# Patient Record
Sex: Female | Born: 1940
Health system: Southern US, Community
[De-identification: ages and names within clinical notes are randomized; demographics above are authoritative.]

## PROBLEM LIST (undated history)

## (undated) DIAGNOSIS — I10 Essential (primary) hypertension: Secondary | ICD-10-CM

## (undated) DIAGNOSIS — E559 Vitamin D deficiency, unspecified: Secondary | ICD-10-CM

## (undated) DIAGNOSIS — E079 Disorder of thyroid, unspecified: Secondary | ICD-10-CM

## (undated) DIAGNOSIS — M1990 Unspecified osteoarthritis, unspecified site: Secondary | ICD-10-CM

## (undated) DIAGNOSIS — Z8489 Family history of other specified conditions: Secondary | ICD-10-CM

## (undated) DIAGNOSIS — J189 Pneumonia, unspecified organism: Secondary | ICD-10-CM

## (undated) DIAGNOSIS — H3530 Unspecified macular degeneration: Secondary | ICD-10-CM

## (undated) DIAGNOSIS — N390 Urinary tract infection, site not specified: Secondary | ICD-10-CM

## (undated) DIAGNOSIS — D510 Vitamin B12 deficiency anemia due to intrinsic factor deficiency: Secondary | ICD-10-CM

## (undated) DIAGNOSIS — F419 Anxiety disorder, unspecified: Secondary | ICD-10-CM

## (undated) DIAGNOSIS — K219 Gastro-esophageal reflux disease without esophagitis: Secondary | ICD-10-CM

## (undated) DIAGNOSIS — H3320 Serous retinal detachment, unspecified eye: Secondary | ICD-10-CM

## (undated) DIAGNOSIS — R519 Headache, unspecified: Secondary | ICD-10-CM

## (undated) DIAGNOSIS — I8390 Asymptomatic varicose veins of unspecified lower extremity: Secondary | ICD-10-CM

## (undated) DIAGNOSIS — M797 Fibromyalgia: Secondary | ICD-10-CM

## (undated) DIAGNOSIS — K259 Gastric ulcer, unspecified as acute or chronic, without hemorrhage or perforation: Secondary | ICD-10-CM

## (undated) DIAGNOSIS — R32 Unspecified urinary incontinence: Secondary | ICD-10-CM

## (undated) DIAGNOSIS — N189 Chronic kidney disease, unspecified: Secondary | ICD-10-CM

## (undated) DIAGNOSIS — E039 Hypothyroidism, unspecified: Secondary | ICD-10-CM

## (undated) DIAGNOSIS — D649 Anemia, unspecified: Secondary | ICD-10-CM

## (undated) DIAGNOSIS — I701 Atherosclerosis of renal artery: Secondary | ICD-10-CM

## (undated) HISTORY — PX: COLONOSCOPY: SHX174

## (undated) HISTORY — DX: Unspecified urinary incontinence: R32

## (undated) HISTORY — DX: Anemia, unspecified: D64.9

## (undated) HISTORY — PX: APPENDECTOMY: SHX54

## (undated) HISTORY — DX: Hypothyroidism, unspecified: E03.9

## (undated) HISTORY — PX: BLADDER SUSPENSION: SHX72

## (undated) HISTORY — DX: Unspecified macular degeneration: H35.30

## (undated) HISTORY — DX: Unspecified osteoarthritis, unspecified site: M19.90

## (undated) HISTORY — DX: Serous retinal detachment, unspecified eye: H33.20

## (undated) HISTORY — DX: Fibromyalgia: M79.7

## (undated) HISTORY — DX: Vitamin B12 deficiency anemia due to intrinsic factor deficiency: D51.0

## (undated) HISTORY — PX: ABDOMINAL HYSTERECTOMY: SHX81

## (undated) HISTORY — DX: Disorder of thyroid, unspecified: E07.9

## (undated) HISTORY — DX: Asymptomatic varicose veins of unspecified lower extremity: I83.90

## (undated) HISTORY — DX: Essential (primary) hypertension: I10

## (undated) HISTORY — DX: Gastric ulcer, unspecified as acute or chronic, without hemorrhage or perforation: K25.9

## (undated) HISTORY — DX: Vitamin D deficiency, unspecified: E55.9

## (undated) HISTORY — DX: Atherosclerosis of renal artery: I70.1

---

## 1998-04-15 ENCOUNTER — Other Ambulatory Visit: Admission: RE | Admit: 1998-04-15 | Discharge: 1998-04-15 | Payer: Self-pay | Admitting: Obstetrics and Gynecology

## 1999-07-30 ENCOUNTER — Encounter: Payer: Self-pay | Admitting: Specialist

## 1999-07-30 ENCOUNTER — Encounter: Admission: RE | Admit: 1999-07-30 | Discharge: 1999-07-30 | Payer: Self-pay | Admitting: Specialist

## 2002-10-09 ENCOUNTER — Encounter: Admission: RE | Admit: 2002-10-09 | Discharge: 2002-10-09 | Payer: Self-pay | Admitting: Specialist

## 2002-10-09 ENCOUNTER — Encounter: Payer: Self-pay | Admitting: Specialist

## 2003-12-12 ENCOUNTER — Other Ambulatory Visit: Admission: RE | Admit: 2003-12-12 | Discharge: 2003-12-12 | Payer: Self-pay | Admitting: General Surgery

## 2005-08-26 ENCOUNTER — Encounter: Admission: RE | Admit: 2005-08-26 | Discharge: 2005-08-26 | Payer: Self-pay | Admitting: Family Medicine

## 2007-08-24 ENCOUNTER — Encounter: Admission: RE | Admit: 2007-08-24 | Discharge: 2007-08-24 | Payer: Self-pay | Admitting: Family Medicine

## 2009-09-11 ENCOUNTER — Encounter: Admission: RE | Admit: 2009-09-11 | Discharge: 2009-09-11 | Payer: Self-pay | Admitting: Family Medicine

## 2009-10-09 ENCOUNTER — Encounter: Admission: RE | Admit: 2009-10-09 | Discharge: 2009-10-09 | Payer: Self-pay | Admitting: Family Medicine

## 2009-10-10 ENCOUNTER — Encounter: Admission: RE | Admit: 2009-10-10 | Discharge: 2009-11-14 | Payer: Self-pay | Admitting: Family Medicine

## 2010-02-21 ENCOUNTER — Emergency Department (HOSPITAL_BASED_OUTPATIENT_CLINIC_OR_DEPARTMENT_OTHER): Admission: EM | Admit: 2010-02-21 | Discharge: 2010-02-21 | Payer: Self-pay | Admitting: Emergency Medicine

## 2010-04-08 ENCOUNTER — Emergency Department (HOSPITAL_BASED_OUTPATIENT_CLINIC_OR_DEPARTMENT_OTHER): Admission: EM | Admit: 2010-04-08 | Discharge: 2010-04-09 | Payer: Self-pay | Admitting: Emergency Medicine

## 2010-09-07 ENCOUNTER — Encounter: Payer: Self-pay | Admitting: Family Medicine

## 2010-11-02 LAB — URINALYSIS, ROUTINE W REFLEX MICROSCOPIC
Glucose, UA: NEGATIVE mg/dL
Hgb urine dipstick: NEGATIVE
Ketones, ur: NEGATIVE mg/dL
Nitrite: NEGATIVE
Protein, ur: NEGATIVE mg/dL
Specific Gravity, Urine: 1.02 (ref 1.005–1.030)
Urobilinogen, UA: 0.2 mg/dL (ref 0.0–1.0)
pH: 5 (ref 5.0–8.0)

## 2010-11-02 LAB — BASIC METABOLIC PANEL
BUN: 42 mg/dL — ABNORMAL HIGH (ref 6–23)
CO2: 23 mEq/L (ref 19–32)
Calcium: 9.2 mg/dL (ref 8.4–10.5)
Chloride: 103 mEq/L (ref 96–112)
Creatinine, Ser: 2.2 mg/dL — ABNORMAL HIGH (ref 0.4–1.2)
GFR calc non Af Amer: 22 mL/min — ABNORMAL LOW (ref >60)
Glucose, Bld: 82 mg/dL (ref 70–99)
Potassium: 5.1 mEq/L (ref 3.5–5.1)
Sodium: 138 mEq/L (ref 135–145)

## 2011-03-25 ENCOUNTER — Encounter (INDEPENDENT_AMBULATORY_CARE_PROVIDER_SITE_OTHER): Payer: Medicare Other | Admitting: Ophthalmology

## 2011-03-25 DIAGNOSIS — H43819 Vitreous degeneration, unspecified eye: Secondary | ICD-10-CM

## 2011-03-25 DIAGNOSIS — H3530 Unspecified macular degeneration: Secondary | ICD-10-CM

## 2011-04-07 ENCOUNTER — Encounter: Payer: Self-pay | Admitting: Surgery

## 2011-05-12 ENCOUNTER — Encounter: Payer: Self-pay | Admitting: Vascular Surgery

## 2011-05-13 ENCOUNTER — Ambulatory Visit (INDEPENDENT_AMBULATORY_CARE_PROVIDER_SITE_OTHER): Payer: Medicare Other | Admitting: Vascular Surgery

## 2011-05-13 ENCOUNTER — Encounter: Payer: Self-pay | Admitting: Vascular Surgery

## 2011-05-13 ENCOUNTER — Ambulatory Visit (INDEPENDENT_AMBULATORY_CARE_PROVIDER_SITE_OTHER): Payer: Medicare Other | Admitting: *Deleted

## 2011-05-13 VITALS — BP 149/67 | HR 61 | Resp 16 | Ht 63.0 in | Wt 155.0 lb

## 2011-05-13 DIAGNOSIS — I83893 Varicose veins of bilateral lower extremities with other complications: Secondary | ICD-10-CM

## 2011-05-13 NOTE — Progress Notes (Signed)
The patient presents today for evaluation of bilateral lower extremity venous hypertension. She has had multiple telangiectasia and reticular veins bilaterally. She has been no history of DVT. She did have a recent episode of bleeding from her right ankle. She had shaved and have the bleeding from the superficial telangiectasia on her lateral ankle. EMS was called controlled this with pressure. She's had no other prior or subsequent bleeding. She does have a history of prior sclerotherapy and in the distant past but no prior venous surgery. He does not have any discomfort related to the venous pathology. She does not have any significant swelling in her lower extremities.  Past Medical History  Diagnosis Date  . Hypertension   . Anemia   . Thyroid disease   . Varicose veins     History  Substance Use Topics  . Smoking status: Never Smoker   . Smokeless tobacco: Never Used  . Alcohol Use: No    History reviewed. No pertinent family history.  Allergies  Allergen Reactions  . Astelin   . Ceftin   . Codeine   . Cortisone   . Dyclonine     Pt. States she experienced a seizure after taking this medication.  Gaynelle Cage Flavor   . Toviaz (Fesoterodine Fumarate) Nausea Only  . Zestril (Lisinopril) Cough  . Zithromax (Azithromycin Dihydrate)   . Zofran Rash    Current outpatient prescriptions:amLODipine-benazepril (LOTREL) 5-10 MG per capsule, Take 1 capsule by mouth daily.  , Disp: , Rfl: ;  Calcium Carbonate-Vitamin D (CALCIUM + D PO), Take by mouth.  , Disp: , Rfl: ;  Cyanocobalamin (VITAMIN B-12 IJ), Inject 1,000 mg as directed every 21 ( twenty-one) days.  , Disp: , Rfl: ;  diphenhydrAMINE (BENADRYL) 25 mg capsule, Take 50 mg by mouth every 6 (six) hours as needed.  , Disp: , Rfl:  fexofenadine (ALLEGRA) 180 MG tablet, Take 180 mg by mouth daily.  , Disp: , Rfl: ;  fluticasone (FLONASE) 50 MCG/ACT nasal spray, Place 2 sprays into the nose daily.  , Disp: , Rfl: ;  folic acid  (FOLVITE) 800 MCG tablet, Take 800 mcg by mouth daily.  , Disp: , Rfl: ;  Glucosamine-Chondroitin-MSM (TRIPLE FLEX) 500-400-125 MG TABS, Take 1 tablet by mouth daily as needed.  , Disp: , Rfl:  levothyroxine (SYNTHROID, LEVOTHROID) 112 MCG tablet, Take 112 mcg by mouth daily.  , Disp: , Rfl: ;  losartan (COZAAR) 100 MG tablet, Take 100 mg by mouth daily.  , Disp: , Rfl: ;  Multiple Vitamins-Minerals (CENTRUM SILVER ULTRA WOMENS) TABS, Take by mouth.  , Disp: , Rfl: ;  Omega-3 Fatty Acids (FISH OIL DOUBLE STRENGTH) 1200 MG CAPS, Take 1 capsule by mouth daily.  , Disp: , Rfl:  Polyethyl Glycol-Propyl Glycol (SYSTANE) 0.4-0.3 % SOLN, Apply 1 drop to eye 3 (three) times daily.  , Disp: , Rfl: ;  propranolol (INDERAL) 10 MG tablet, Take 10 mg by mouth as needed.  , Disp: , Rfl: ;  sulfamethoxazole-trimethoprim (BACTRIM DS) 800-160 MG per tablet, Take 1 tablet by mouth 2 (two) times daily.  , Disp: , Rfl: ;  Vitamin D, Ergocalciferol, (DRISDOL) 50000 UNITS CAPS, Take 50,000 Units by mouth.  , Disp: , Rfl:  estrogens, conjugated, (PREMARIN) 0.625 MG tablet, Take 0.625 mg by mouth daily. Take daily for 21 days then do not take for 7 days. , Disp: , Rfl:   BP 149/67  Pulse 61  Resp 16  Ht 5\' 3"  (1.6  m)  Wt 155 lb (70.308 kg)  BMI 27.46 kg/m2  Body mass index is 27.46 kg/(m^2).      Review of systems: No cardiac disease review of systems otherwise normal.  Physical exam: Well-developed well-nourished white female no acute distress. HEENT is normal. Radial and dorsalis pedis pulses 2+ bilaterally. Musculoskeletal no major deformity sinuses. Neurologic no focal weakness paresthesias. Skin without ulcers or rashes. She does have telangiectasia on both lower extremities and her upper thighs and extending down onto her ankles and dorsal her feet bilaterally.  Noninvasive venous study: No evidence of DVT. She does have some reflux in her left deep system. He does not have any significant reflux in her greater  small saphenous veins bilaterally. He does have a great deal of tributary branches arising from her saphenofemoral junction was particular the right side.   Impression and plan: No significant saphenous or deep venous reflux. History of bleed from a right ankle telangiectasia. Did discuss first a for this with elevation and compression. Did explain the possible treatment for telangiectasia with the sclerotherapy. She is concern regarding the appearance of these. That this would be out-of-pocket expenses this would not entered covered by insurance carrier. He will notify us if he wishes to proceed with treatment.

## 2011-05-20 NOTE — Procedures (Unsigned)
LOWER EXTREMITY VENOUS REFLUX EXAM  INDICATION:  Painful varicose veins.  EXAM:  Using color-flow imaging and pulse Doppler spectral analysis, the bilateral common femoral, superficial femoral, popliteal, posterior tibial, greater and lesser saphenous veins are evaluated.  There is evidence suggesting deep venous insufficiency in the left lower extremity.  The bilateral saphenofemoral junction is competent. The bilateral GSV is competent.  The left proximal short saphenous vein demonstrates incompetency with the calibers ranging from 0.48 cm to 0.34 cm.  GSV Diameter (used if found to be incompetent only)                                           Right    Left Proximal Greater Saphenous Vein           cm       cm Proximal-to-mid-thigh                     cm       cm Mid thigh                                 cm       cm Mid-distal thigh                          cm       cm Distal thigh                              cm       cm Knee                                      cm       cm  IMPRESSION: 1. Bilateral greater saphenous veins are competent. 2. The left deep venous system is not competent with Reflux of     >517milliseconds. 3. The left lesser saphenous vein is not competent with Reflux of     >568milliseconds.  ___________________________________________ Larina Earthly, M.D.  LT/MEDQ  D:  05/13/2011  T:  05/13/2011  Job:  [id number]

## 2012-01-25 ENCOUNTER — Other Ambulatory Visit: Payer: Self-pay | Admitting: Interventional Cardiology

## 2012-01-27 ENCOUNTER — Encounter (HOSPITAL_COMMUNITY): Payer: Self-pay | Admitting: Pharmacist

## 2012-01-27 MED ORDER — DIPHENHYDRAMINE HCL 50 MG/ML IJ SOLN: 25.0000 mg | Freq: Once | INTRAMUSCULAR | Status: AC

## 2012-01-27 MED ORDER — SODIUM CHLORIDE 0.9 % IV SOLN: 20.0000 mg | Freq: Once | INTRAVENOUS | Status: AC

## 2012-01-28 ENCOUNTER — Ambulatory Visit (HOSPITAL_COMMUNITY)
Admission: RE | Admit: 2012-01-28 | Discharge: 2012-01-28 | Disposition: A | Discharge status: Home / Self Care | Payer: Medicare Other | Source: Ambulatory Visit | Attending: Interventional Cardiology | Admitting: Interventional Cardiology

## 2012-01-28 ENCOUNTER — Encounter (HOSPITAL_COMMUNITY)
Admission: RE | Disposition: A | Discharge status: Home / Self Care | Payer: Self-pay | Source: Ambulatory Visit | Attending: Interventional Cardiology

## 2012-01-28 DIAGNOSIS — R935 Abnormal findings on diagnostic imaging of other abdominal regions, including retroperitoneum: Secondary | ICD-10-CM | POA: Insufficient documentation

## 2012-01-28 DIAGNOSIS — I10 Essential (primary) hypertension: Secondary | ICD-10-CM | POA: Insufficient documentation

## 2012-01-28 HISTORY — PX: RENAL ANGIOGRAM: SHX5509

## 2012-01-28 SURGERY — RENAL ANGIOGRAM
Anesthesia: LOCAL

## 2012-01-28 MED ORDER — HYDRALAZINE HCL 20 MG/ML IJ SOLN
INTRAMUSCULAR | Status: AC
  Filled 2012-01-28: qty 1

## 2012-01-28 MED ORDER — SODIUM CHLORIDE 0.9 % IJ SOLN: 3.0000 mL | INTRAMUSCULAR | Status: DC | PRN

## 2012-01-28 MED ORDER — FAMOTIDINE IN NACL 20-0.9 MG/50ML-% IV SOLN: 20.0000 mg | Freq: Once | INTRAVENOUS | Status: DC

## 2012-01-28 MED ORDER — SODIUM CHLORIDE 0.9 % IV SOLN: 250.0000 mL | INTRAVENOUS | Status: DC | PRN

## 2012-01-28 MED ORDER — ASPIRIN 81 MG PO CHEW
324.0000 mg | CHEWABLE_TABLET | ORAL | Status: AC
  Administered 2012-01-28: 324 mg via ORAL

## 2012-01-28 MED ORDER — PROPRANOLOL HCL 10 MG PO TABS
10.0000 mg | ORAL_TABLET | ORAL | Status: AC
  Administered 2012-01-28: 10 mg via ORAL
  Filled 2012-01-28: qty 1

## 2012-01-28 MED ORDER — FENTANYL CITRATE 0.05 MG/ML IJ SOLN
INTRAMUSCULAR | Status: AC
  Filled 2012-01-28: qty 2

## 2012-01-28 MED ORDER — DIAZEPAM 5 MG PO TABS
5.0000 mg | ORAL_TABLET | ORAL | Status: AC
  Administered 2012-01-28: 5 mg via ORAL

## 2012-01-28 MED ORDER — DIPHENHYDRAMINE HCL 50 MG/ML IJ SOLN: 25.0000 mg | Freq: Once | INTRAMUSCULAR | Status: DC

## 2012-01-28 MED ORDER — DIAZEPAM 5 MG PO TABS
ORAL_TABLET | ORAL | Status: AC
  Administered 2012-01-28: 5 mg via ORAL
  Filled 2012-01-28: qty 1

## 2012-01-28 MED ORDER — HEPARIN (PORCINE) IN NACL 2-0.9 UNIT/ML-% IJ SOLN
INTRAMUSCULAR | Status: AC
  Filled 2012-01-28: qty 1000

## 2012-01-28 MED ORDER — MIDAZOLAM HCL 2 MG/2ML IJ SOLN
INTRAMUSCULAR | Status: AC
  Filled 2012-01-28: qty 2

## 2012-01-28 MED ORDER — SODIUM CHLORIDE 0.9 % IJ SOLN: 3.0000 mL | Freq: Two times a day (BID) | INTRAMUSCULAR | Status: DC

## 2012-01-28 MED ORDER — LIDOCAINE HCL (PF) 1 % IJ SOLN
INTRAMUSCULAR | Status: AC
  Filled 2012-01-28: qty 30

## 2012-01-28 MED ORDER — SODIUM CHLORIDE 0.9 % IV SOLN
INTRAVENOUS | Status: DC
  Administered 2012-01-28: 08:00:00 via INTRAVENOUS

## 2012-01-28 MED ORDER — ASPIRIN 81 MG PO CHEW
CHEWABLE_TABLET | ORAL | Status: AC
  Administered 2012-01-28: 324 mg via ORAL
  Filled 2012-01-28: qty 4

## 2012-01-28 NOTE — CV Procedure (Signed)
PROCEDURE:  Abdominal aortogram.  Bilateral selective renal angiogram.  INDICATIONS:  HTN, abnormal renal duplex.  The risks, benefits, and details of the procedure were explained to the patient.  The patient verbalized understanding and wanted to proceed.  Informed written consent was obtained.    PROCEDURE TECHNIQUE:  After Xylocaine anesthesia a 103F sheath was placed in the right femoral artery with a single anterior needle wall stick.   A pigtail catheter was used to perform an aortogram.  Selective angiography of the main renal arteries and accessory arteries bilaterally were performed using a JR 4 catheter.  Hydralazine IV was given for BP control during the procedure.   CONTRAST:  Total of 55 cc.  COMPLICATIONS:  None.    HEMODYNAMICS:  Aortic pressure was 169/70  ANGIOGRAPHIC DATA:   There are dual renal arteries to both kidneys.  On the left, the upper renal artery is larger and the lower is smaller.  Both are widely patent. On the right, the upper renal artery is larger and the lower is smaller.  Both are widely patent.   IMPRESSIONS:  1. No abdominal aortic aneurysm. 2.   No significant renal artery stenosis.  RECOMMENDATION:  Medical therapy for BP control.  Despite taking her medications, her central aortic pressure was elevated.  Will continue to titrate medications.

## 2012-01-28 NOTE — Discharge Instructions (Signed)
  Groin Site Care Refer to this sheet in the next few weeks. These instructions provide you with information on caring for yourself after your procedure. Your caregiver may also give you more specific instructions. Your treatment has been planned according to current medical practices, but problems sometimes occur. Call your caregiver if you have any problems or questions after your procedure. HOME CARE INSTRUCTIONS  You may shower 24 hours after the procedure. Remove the bandage (dressing) and gently wash the site with plain soap and water. Gently pat the site dry.   Do not apply powder or lotion to the site.   Do not sit in a bathtub, swimming pool, or whirlpool for 5 to 7 days.   No bending, squatting, or lifting anything over 10 pounds (4.5 kg) as directed by your caregiver.   Inspect the site at least twice daily.   Do not drive home if you are discharged the same day of the procedure. Have someone else drive you.   You may drive 24 hours after the procedure unless otherwise instructed by your caregiver.  What to expect:  Any bruising will usually fade within 1 to 2 weeks.   Blood that collects in the tissue (hematoma) may be painful to the touch. It should usually decrease in size and tenderness within 1 to 2 weeks.  SEEK IMMEDIATE MEDICAL CARE IF:  You have unusual pain at the groin site or down the affected leg.   You have redness, warmth, swelling, or pain at the groin site.   You have drainage (other than a small amount of blood on the dressing).   You have chills.   You have a fever or persistent symptoms for more than 72 hours.   You have a fever and your symptoms suddenly get worse.   Your leg becomes pale, cool, tingly, or numb.   You have heavy bleeding from the site. Hold pressure on the site.  Document Released: 09/05/2010 Document Revised: 07/23/2011 Document Reviewed: 09/05/2010 [facility] Patient Information 2012 [place], [place].  No lifting more than 10  lbs for one week. May return to work on Monday February 01, 2012

## 2012-01-28 NOTE — H&P (Signed)
Date of Initial H&P:  01/20/12  History reviewed, patient examined, no change in status, stable for surgery.

## 2012-07-11 ENCOUNTER — Other Ambulatory Visit: Payer: Self-pay | Admitting: *Deleted

## 2012-07-11 DIAGNOSIS — I83893 Varicose veins of bilateral lower extremities with other complications: Secondary | ICD-10-CM

## 2012-07-12 ENCOUNTER — Ambulatory Visit: Payer: Medicare Other | Admitting: Vascular Surgery

## 2012-07-19 ENCOUNTER — Ambulatory Visit: Payer: Medicare Other | Admitting: Vascular Surgery

## 2012-07-20 ENCOUNTER — Encounter: Payer: Self-pay | Admitting: Vascular Surgery

## 2012-07-21 ENCOUNTER — Encounter: Payer: Self-pay | Admitting: Vascular Surgery

## 2012-07-21 ENCOUNTER — Encounter (INDEPENDENT_AMBULATORY_CARE_PROVIDER_SITE_OTHER): Payer: Medicare Other | Admitting: *Deleted

## 2012-07-21 ENCOUNTER — Ambulatory Visit (INDEPENDENT_AMBULATORY_CARE_PROVIDER_SITE_OTHER): Payer: Medicare Other | Admitting: Vascular Surgery

## 2012-07-21 VITALS — BP 144/79 | HR 58 | Resp 18 | Ht 63.0 in | Wt 149.0 lb

## 2012-07-21 DIAGNOSIS — I83893 Varicose veins of bilateral lower extremities with other complications: Secondary | ICD-10-CM

## 2012-07-21 NOTE — Progress Notes (Signed)
The patient presents today for continued discussion regarding her venous pathology. Had seen her a number of years ago when she was complaining mainly of telangiectasia with minimal discomfort over these. She has had progression of the tributary varicosities throughout her item lateral thigh. She has pain specifically overriding these. Does not have any further bleeding from her right ankle telangiectasia although there has been progression of the telangiectasia on both legs. She does not have any history of DVT and does not have any significant swelling. She reports that the pain is worse with prolonged standing and progresses throughout the day it is a dull ache over these areas.  Past Medical History  Diagnosis Date  . Hypertension   . Anemia   . Thyroid disease   . Varicose veins     History  Substance Use Topics  . Smoking status: Never Smoker   . Smokeless tobacco: Never Used  . Alcohol Use: No    Family History  Problem Relation Age of Onset  . Cancer Mother     leukemia  . Stroke Mother   . Heart disease Father   . Heart disease Sister     Allergies  Allergen Reactions  . Codeine Nausea And Vomiting  . Contrast Media (Iodinated Diagnostic Agents)     Rash - given during an eye procedure  . Cortisone Other (See Comments)    Body swelling. Has taken other steroids  . Dyclonine     Pt. States she experienced a seizure after taking this medication.  Rose Moody Flavor     unknown  . Toviaz (Fesoterodine Fumarate) Other (See Comments)    Dehydration and confusion  . Zestril (Lisinopril) Cough  . Astelin Rash  . Cefuroxime Axetil Rash  . Ciprofloxacin Rash  . Estradiol Rash  . Zithromax (Azithromycin Dihydrate) Rash  . Zofran Rash    Current outpatient prescriptions:amLODipine (NORVASC) 5 MG tablet, Take 5 mg by mouth every evening., Disp: , Rfl: ;  Calcium Carbonate-Vitamin D (CALCIUM + D PO), Take 1 tablet by mouth 2 (two) times daily. , Disp: , Rfl: ;   cyanocobalamin (,VITAMIN B-12,) 1000 MCG/ML injection, Inject 1,000 mcg into the muscle every 14 (fourteen) days. Given in doctor's office, Disp: , Rfl:  diphenhydrAMINE (BENADRYL) 25 mg capsule, Take 50 mg by mouth at bedtime. , Disp: , Rfl: ;  estradiol (ESTRACE) 0.1 MG/GM vaginal cream, Place 1 g vaginally once a week. Sundays, Disp: , Rfl: ;  Fexofenadine HCl (ALLEGRA PO), Take 1 tablet by mouth every morning. OTC, Disp: , Rfl: ;  FOLIC ACID PO, Take 1 tablet by mouth every evening., Disp: , Rfl:  Glucosamine-Chondroitin-MSM (TRIPLE FLEX) 500-400-125 MG TABS, Take 1 tablet by mouth every evening. , Disp: , Rfl: ;  levothyroxine (SYNTHROID, LEVOTHROID) 112 MCG tablet, Take 112 mcg by mouth daily before breakfast. , Disp: , Rfl: ;  losartan (COZAAR) 100 MG tablet, Take 100 mg by mouth every morning. , Disp: , Rfl: ;  Multiple Vitamins-Minerals (CENTRUM SILVER ULTRA WOMENS) TABS, Take 1 tablet by mouth every morning. , Disp: , Rfl:  Multiple Vitamins-Minerals (PRESERVISION/LUTEIN PO), Take 1 tablet by mouth 2 (two) times daily., Disp: , Rfl: ;  Omega-3 Fatty Acids (FISH OIL PO), Take 1 capsule by mouth 2 (two) times daily., Disp: , Rfl: ;  Polyethyl Glycol-Propyl Glycol (SYSTANE) 0.4-0.3 % SOLN, Place 1 drop into both eyes 3 (three) times daily. , Disp: , Rfl: ;  propranolol (INDERAL) 10 MG tablet, Take 10 mg by  mouth daily as needed. For blood pressure >160, Disp: , Rfl:  Vitamin D, Ergocalciferol, (DRISDOL) 50000 UNITS CAPS, Take 50,000 Units by mouth every 7 (seven) days. Wednesday, Disp: , Rfl:  No current facility-administered medications for this visit. Facility-Administered Medications Ordered in Other Visits: diphenhydrAMINE (BENADRYL) injection 25 mg, 25 mg, Intravenous, Once, Corky Crafts, MD;  famotidine (PEPCID) 20 mg in sodium chloride 0.9 % 50 mL IVPB, 20 mg, Intravenous, Once, Corky Crafts, MD  BP 144/79  Pulse 58  Resp 18  Ht 5\' 3"  (1.6 m)  Wt 149 lb (67.586 kg)  BMI  26.39 kg/m2  Body mass index is 26.39 kg/(m^2).       Physical exam: Well-developed well-nourished white female in no acute distress She is grossly intact neurologically Pulse status is 2+ dorsalis pedis pulses bilaterally Is not having significant lower surety swelling. She does have marked telangiectasia throughout both legs. She does have some scattered varicosities on her left leg but the more announced area with discomfort is over her lateral thigh. She does have the typical pattern of varicosities extending from her groin over the lateral anterior thigh.  Venous duplex today in our office reveals mild common femoral vein reflux. Otherwise no deep reflux in the right leg she does not have any reflux in her great saphenous vein or her small saphenous vein on the right. She does have typical pattern of an anterior accessory branch to communicate at the saphenofemoral junction. The varicosities all arise off of this. 1 left leg she does have reflux in her left small saphenous vein does not have any difficulty associated with this.  This reports that she has worn compression garments and it has been quite some time. He have recommended thigh-high graduated compression to hopefully improve the pain that she is having related to the varicosities. We will see her again in 3 months for further discussion. If she would be an excellent candidate for stab phlebectomy removal of his painful varicosities should she fail conservative treatment

## 2012-10-17 ENCOUNTER — Encounter: Payer: Self-pay | Admitting: Vascular Surgery

## 2012-10-18 ENCOUNTER — Encounter: Payer: Self-pay | Admitting: Vascular Surgery

## 2012-10-18 ENCOUNTER — Ambulatory Visit: Payer: Medicare Other | Admitting: Vascular Surgery

## 2012-10-18 ENCOUNTER — Ambulatory Visit (INDEPENDENT_AMBULATORY_CARE_PROVIDER_SITE_OTHER): Payer: Medicare Other | Admitting: Vascular Surgery

## 2012-10-18 VITALS — BP 145/85 | HR 73 | Resp 16 | Ht 64.0 in | Wt 153.0 lb

## 2012-10-18 DIAGNOSIS — I83893 Varicose veins of bilateral lower extremities with other complications: Secondary | ICD-10-CM

## 2012-10-18 NOTE — Progress Notes (Signed)
Problems with Activities of Daily Living Secondary to Leg Pain  1. 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   2. Rose Moody states all activities that require prolonged standing (cooking, cleaning, shopping) are very difficult due to leg pain.      Failure of  Conservative Therapy:  1. Worn 20-30 mm Hg thigh high compression hose >3 months with no relief of symptoms.  2. Frequently elevates legs-no relief of symptoms  3. Taken Ibuprofen 600 Mg TID with no relief of symptoms.  The patient tends to have discomfort despite conservative treatment. She has pain specifically over the large tributary varicosities extending over her anterior thigh to her lateral knee and lateral calf on the right. I reimaged this area with the SonoSite ultrasound. She does have a short segment connection from the saphenofemoral junction to and anterior accessory saphenous branch and then immediately has all of these varicosities arising from this. She does not have a specific saphenous vein that is feeding these varicosities. I feel that her best treatment result would be from stab phlebectomy of these multiple varicosities over her anterior thigh lateral knee and lateral calf on the right. I explained this would be done as an outpatient under local tumescent anesthesia that she would be able to be mandatory immediately following the procedure. I explained that typically she would have minimal discomfort following this and would limit her activities for 48 hours. She wished to proceed as soon as possible.

## 2012-11-22 ENCOUNTER — Ambulatory Visit: Payer: Medicare Other | Admitting: Vascular Surgery

## 2012-12-07 ENCOUNTER — Encounter: Payer: Self-pay | Admitting: Vascular Surgery

## 2012-12-08 ENCOUNTER — Ambulatory Visit (INDEPENDENT_AMBULATORY_CARE_PROVIDER_SITE_OTHER): Payer: Medicare Other | Admitting: Vascular Surgery

## 2012-12-08 ENCOUNTER — Encounter: Payer: Self-pay | Admitting: Vascular Surgery

## 2012-12-08 VITALS — BP 158/77 | HR 71 | Resp 16 | Ht 64.0 in | Wt 150.0 lb

## 2012-12-08 DIAGNOSIS — I83893 Varicose veins of bilateral lower extremities with other complications: Secondary | ICD-10-CM

## 2012-12-08 HISTORY — PX: OTHER SURGICAL HISTORY: SHX169

## 2012-12-08 NOTE — Progress Notes (Signed)
STAB PHLEBECTOMY PROCEDURE     Date: 12/08/2012    Rose Moody DOB:[id number]  Consent signed: Yes  Surgeon:T.F. Warrick Llera    BP 158/77  Pulse 71  Resp 16  Ht 5\' 4"  (1.626 m)  Wt 150 lb (68.04 kg)  BMI 25.73 kg/m2  Start time: 10:30AM   End time: 11:50AM  Tumescent Anesthesia: 450 cc 0.9% NaCl with 50 cc Lidocaine HCL with 1% Epi and 15 cc 8.4% NaHCO3  Local Anesthesia: 1.5 cc Lidocaine HCL and NaHCO3 (ratio 2:1)       Stab Phlebectomy: >20 Sites: Thigh and Calf  Right leg  Patient tolerated procedure well: Yes    Description of Procedure:  After marking the secondary varicosities in the standing position, the patient was placed on the operating table in the supine position, and the right leg was prepped and draped in sterile fashion. Local anesthetic was administered.  The patient was then put into Trendelenburg position.  Local anesthetic was utilized overlying the marked varicosities.  Greater than >20 stab wounds were made using the tip of an 11 blade; and using the vein hook,  The phlebectomies were performed using a hemostat to avulse these varicosities.  Adequate hemostasis was achieved, and steri strips were applied to the stab wound.      ABD pads and thigh high compression stockings were applied.  Ace wrap bandages were applied over the phlebectomy sites..  Blood loss was less than 15 cc.  The patient ambulated out of the operating room having tolerated the procedure well.

## 2012-12-13 ENCOUNTER — Telehealth: Payer: Self-pay | Admitting: *Deleted

## 2012-12-13 NOTE — Telephone Encounter (Signed)
12/13/2012  Time: 1:26 PM   Patient Name: Rose Moody  Patient of: T.F. Early  Procedure:stab phlebectomy > 20 incisions right leg   12-08-2012  Reached patient at home and checked  Her status  Yes    Comments/Actions Taken: Ms. Bronkema states no problem with bleeding/oozing or pain.     Ms. Lamison states she had trouble sleeping last night as she could not get comfortable.  Reviewed post procedural instructions with her and reminded her of follow up with Dr. Arbie Cookey on 12-29-2012.    @SIGNATURE @

## 2012-12-28 ENCOUNTER — Encounter: Payer: Self-pay | Admitting: Vascular Surgery

## 2012-12-29 ENCOUNTER — Encounter: Payer: Self-pay | Admitting: Vascular Surgery

## 2012-12-29 ENCOUNTER — Ambulatory Visit (INDEPENDENT_AMBULATORY_CARE_PROVIDER_SITE_OTHER): Payer: Medicare Other | Admitting: Vascular Surgery

## 2012-12-29 VITALS — BP 118/76 | HR 70 | Resp 18 | Ht 64.0 in | Wt 150.0 lb

## 2012-12-29 DIAGNOSIS — I83893 Varicose veins of bilateral lower extremities with other complications: Secondary | ICD-10-CM

## 2012-12-29 NOTE — Progress Notes (Signed)
The patient has today for followup of stab phlebectomy of multiple tributary varicosities in her right thigh and calf. He had minimal discomfort associated with this and mild bruising. This is now resolved.  Physical exam the small stab incisions are all healing quite nicely.   Impression and plan: Successful laser ablation of right great saphenous vein and subsequent stab phlebectomy of multiple tributary varicosities. She is a quite pleased with her results. She does have multiple telangiectasia bilaterally. She reports these are not causing her any discomfort but I may consider sclerotherapy for cosmetic improvement. She will notify should she wish to proceed.

## 2013-05-30 ENCOUNTER — Telehealth: Payer: Self-pay | Admitting: Interventional Cardiology

## 2013-05-30 NOTE — Telephone Encounter (Signed)
Per Dr. Eldridge Dace pt should continue to monitor BP and if it continuously stays elevated above 140/90 pt should call back. Pt doesn't need to take an extra tablet since she missed tablet last week.

## 2013-05-30 NOTE — Telephone Encounter (Signed)
New message   Forgot to take a bp pill one day last week. B/p is now running 145-90 for several days.  Should she take another pill to make up what she failed to take last week?  At work until 6pm today.

## 2013-06-13 ENCOUNTER — Encounter: Payer: Self-pay | Admitting: *Deleted

## 2013-06-14 ENCOUNTER — Ambulatory Visit (INDEPENDENT_AMBULATORY_CARE_PROVIDER_SITE_OTHER): Payer: Medicare Other | Admitting: *Deleted

## 2013-06-14 ENCOUNTER — Ambulatory Visit: Payer: Medicare Other | Admitting: *Deleted

## 2013-06-14 DIAGNOSIS — I83893 Varicose veins of bilateral lower extremities with other complications: Secondary | ICD-10-CM

## 2013-06-14 DIAGNOSIS — I781 Nevus, non-neoplastic: Secondary | ICD-10-CM

## 2013-06-14 NOTE — Progress Notes (Signed)
X=.3% Sotradecol and 5% Asclera administered with a 27g butterfly.  Patient received a total of 15cc.  The patient has lots of spider and reticular veins. Treated most prominent ones. She understands she will need more treatments. Easy access. Will follow prn. Note: Tried some Asclera on her. She said it burned much worse than the Sotradecol.   Photos: yes  Compression stockings applied: yes

## 2013-06-15 ENCOUNTER — Encounter: Payer: Self-pay | Admitting: Vascular Surgery

## 2013-06-21 ENCOUNTER — Ambulatory Visit (INDEPENDENT_AMBULATORY_CARE_PROVIDER_SITE_OTHER): Payer: Medicare Other | Admitting: *Deleted

## 2013-06-21 DIAGNOSIS — I781 Nevus, non-neoplastic: Secondary | ICD-10-CM

## 2013-06-21 NOTE — Progress Notes (Signed)
Patient wanted me to look at a spot that was injected. She thought it might need to be lanced. The stop is not a blood filled blister. It is resolving and does not need to be lanced. Will follow prn.

## 2013-07-28 ENCOUNTER — Emergency Department (HOSPITAL_BASED_OUTPATIENT_CLINIC_OR_DEPARTMENT_OTHER)
Admission: EM | Admit: 2013-07-28 | Discharge: 2013-07-29 | Disposition: A | Discharge status: Home / Self Care | Payer: Medicare Other | Attending: Emergency Medicine | Admitting: Emergency Medicine

## 2013-07-28 ENCOUNTER — Encounter (HOSPITAL_BASED_OUTPATIENT_CLINIC_OR_DEPARTMENT_OTHER): Payer: Self-pay | Admitting: Emergency Medicine

## 2013-07-28 ENCOUNTER — Ambulatory Visit (INDEPENDENT_AMBULATORY_CARE_PROVIDER_SITE_OTHER): Payer: Medicare Other | Admitting: Interventional Cardiology

## 2013-07-28 ENCOUNTER — Encounter: Payer: Self-pay | Admitting: Interventional Cardiology

## 2013-07-28 VITALS — BP 132/64 | HR 71 | Ht 63.75 in | Wt 155.0 lb

## 2013-07-28 DIAGNOSIS — E039 Hypothyroidism, unspecified: Secondary | ICD-10-CM | POA: Insufficient documentation

## 2013-07-28 DIAGNOSIS — T465X1A Poisoning by other antihypertensive drugs, accidental (unintentional), initial encounter: Secondary | ICD-10-CM | POA: Insufficient documentation

## 2013-07-28 DIAGNOSIS — T50901A Poisoning by unspecified drugs, medicaments and biological substances, accidental (unintentional), initial encounter: Secondary | ICD-10-CM

## 2013-07-28 DIAGNOSIS — I10 Essential (primary) hypertension: Secondary | ICD-10-CM | POA: Insufficient documentation

## 2013-07-28 DIAGNOSIS — Z79899 Other long term (current) drug therapy: Secondary | ICD-10-CM | POA: Insufficient documentation

## 2013-07-28 DIAGNOSIS — D649 Anemia, unspecified: Secondary | ICD-10-CM | POA: Insufficient documentation

## 2013-07-28 DIAGNOSIS — T46901A Poisoning by unspecified agents primarily affecting the cardiovascular system, accidental (unintentional), initial encounter: Secondary | ICD-10-CM | POA: Insufficient documentation

## 2013-07-28 DIAGNOSIS — Y929 Unspecified place or not applicable: Secondary | ICD-10-CM | POA: Insufficient documentation

## 2013-07-28 DIAGNOSIS — Y9389 Activity, other specified: Secondary | ICD-10-CM | POA: Insufficient documentation

## 2013-07-28 HISTORY — DX: Urinary tract infection, site not specified: N39.0

## 2013-07-28 NOTE — ED Provider Notes (Addendum)
CSN: [id number]     Arrival date & time 07/28/13  2241 History  This chart was scribed for Obryan Radu Smitty Cords, MD by Caryn Bee, ED Scribe. This patient was seen in room MH01/MH01 and the patient's care was started 11:13 PM.    Chief Complaint  Patient presents with  . Drug Overdose   Patient is a 72 y.o. female presenting with Overdose. The history is provided by the patient. No language interpreter was used.  Drug Overdose This is a new (Accidental) problem. The current episode started 1 to 2 hours ago. The problem occurs rarely. The problem has been resolved. Pertinent negatives include no chest pain, no abdominal pain, no headaches and no shortness of breath. Nothing aggravates the symptoms. Nothing relieves the symptoms. She has tried nothing for the symptoms. The treatment provided significant (None needed.) relief.   HPI Comments: Rose Moody is a 72 y.o. female who presents to the Emergency Department complaining of possible drug overdose. Pt took her prescribed 100mg  Losartan at 8:15 PM and accidentally took another pill at 9:00 PM. Pt has been taking the medication for 2 years. She denies any pain.   Past Medical History  Diagnosis Date  . Hypertension   . Anemia   . Thyroid disease   . Varicose veins    Past Surgical History  Procedure Laterality Date  . Abdominal hysterectomy  age 34  . Appendectomy  age 19  . Bladder suspension  age 56  . Stab phlebectomy Right 12-08-2012    > 20 incisions by Gretta Began MD   Family History  Problem Relation Age of Onset  . Cancer Mother     leukemia  . Stroke Mother   . Heart disease Father   . Heart disease Sister    History  Substance Use Topics  . Smoking status: Never Smoker   . Smokeless tobacco: Never Used  . Alcohol Use: No   OB History   Grav Para Term Preterm Abortions TAB SAB Ect Mult Living                 Review of Systems  Respiratory: Negative for shortness of breath.   Cardiovascular: Negative for  chest pain.  Gastrointestinal: Negative for abdominal pain.  Neurological: Negative for headaches.  Psychiatric/Behavioral: Negative for suicidal ideas, behavioral problems, confusion and self-injury.  All other systems reviewed and are negative.    Allergies  Codeine; Contrast media; Cortisone; Dyclonine; Tequila sunrise flavor; Toviaz; Zestril; Astelin; Cefuroxime axetil; Ciprofloxacin; Estradiol; Zithromax; and Zofran  Home Medications   Current Outpatient Rx  Name  Route  Sig  Dispense  Refill  . amLODipine (NORVASC) 5 MG tablet   Oral   Take 5 mg by mouth every evening.         . Calcium Carbonate-Vitamin D (CALCIUM + D PO)   Oral   Take 1 tablet by mouth 2 (two) times daily.          . cyanocobalamin (,VITAMIN B-12,) 1000 MCG/ML injection   Intramuscular   Inject 1,000 mcg into the muscle every 14 (fourteen) days. Given in doctor's office         . diphenhydrAMINE (BENADRYL) 25 mg capsule   Oral   Take 50 mg by mouth at bedtime.          Marland Kitchen estradiol (ESTRACE) 0.1 MG/GM vaginal cream   Vaginal   Place 1 g vaginally once a week. Sundays         . Fexofenadine HCl (  ALLEGRA PO)   Oral   Take 1 tablet by mouth every morning. OTC         . FOLIC ACID PO   Oral   Take 1 tablet by mouth every evening.         . Glucosamine-Chondroitin-MSM (TRIPLE FLEX) 500-400-125 MG TABS   Oral   Take 1 tablet by mouth every evening.          Marland Kitchen levothyroxine (SYNTHROID, LEVOTHROID) 112 MCG tablet   Oral   Take 112 mcg by mouth daily before breakfast.          . losartan (COZAAR) 100 MG tablet   Oral   Take 100 mg by mouth every morning.          . Multiple Vitamins-Minerals (CENTRUM SILVER ULTRA WOMENS) TABS   Oral   Take 1 tablet by mouth every morning.          . Multiple Vitamins-Minerals (PRESERVISION/LUTEIN PO)   Oral   Take 1 tablet by mouth 2 (two) times daily.         . Omega-3 Fatty Acids (FISH OIL PO)   Oral   Take 1 capsule by mouth 2  (two) times daily.         Bertram Gala Glycol-Propyl Glycol (SYSTANE) 0.4-0.3 % SOLN   Both Eyes   Place 1 drop into both eyes 3 (three) times daily.          . propranolol (INDERAL) 10 MG tablet   Oral   Take 10 mg by mouth daily as needed. For blood pressure >160         . trimethoprim (TRIMPEX) 100 MG tablet               . Vitamin D, Ergocalciferol, (DRISDOL) 50000 UNITS CAPS   Oral   Take 50,000 Units by mouth every 7 (seven) days. Wednesday          BP 146/79  Pulse 79  Temp(Src) 97.7 F (36.5 C) (Oral)  Resp 16  Ht 5\' 3"  (1.6 m)  Wt 155 lb (70.308 kg)  BMI 27.46 kg/m2  SpO2 98%  Physical Exam  Nursing note and vitals reviewed. Constitutional: She is oriented to person, place, and time. She appears well-developed and well-nourished. No distress.  HENT:  Head: Normocephalic and atraumatic.  Mouth/Throat: Uvula is midline and oropharynx is clear and moist. No oropharyngeal exudate.  Eyes: Conjunctivae are normal. Pupils are equal, round, and reactive to light.  Neck: Normal range of motion. Neck supple.  Cardiovascular: Normal rate, regular rhythm and normal heart sounds.  Exam reveals no gallop and no friction rub.   No murmur heard. Pulmonary/Chest: Effort normal and breath sounds normal. No respiratory distress. She has no wheezes. She has no rales. She exhibits no tenderness.  Abdominal: Soft. Bowel sounds are normal. There is no tenderness. There is no rebound.  Musculoskeletal: Normal range of motion. She exhibits no edema.  Neurological: She is alert and oriented to person, place, and time. She has normal reflexes.  Skin: Skin is warm and dry. She is not diaphoretic.  Psychiatric: She has a normal mood and affect. Her behavior is normal.    ED Course  Procedures (including critical care time) DIAGNOSTIC STUDIES: Oxygen Saturation is 98% on room air, normal by my interpretation.    COORDINATION OF CARE: 11:15 PM-Discussed treatment plan with pt  at bedside and pt agreed to plan.   Labs Review Labs Reviewed - No data to display  Imaging Review No results found.  EKG Interpretation   None       MDM  No diagnosis found. Safe for discharge.  Nothing to do per poisone control.  Call your doctor about next dose in am.  Buy a pill case and separation your medication to prevent this from happening again.   I personally performed the services described in this documentation, which was scribed in my presence. The recorded information has been reviewed and is accurate.  I personally performed the services described in this documentation, which was scribed in my presence. The recorded information has been reviewed and is accurate.    Jasmine Awe, MD 07/29/13 0444  Kendon Sedeno K Kimmie Doren-Rasch, MD 07/29/13 [phone]

## 2013-07-28 NOTE — ED Notes (Signed)
Contacted Poison control - [place] - and was told that there is no concern. They are closing the case on their end.

## 2013-07-28 NOTE — Progress Notes (Signed)
Patient ID: Rose Moody, female   DOB: 03-22-1941, 72 y.o.   MRN: [id number]    [address] [place], [place]  [address] Phone: [phone] Fax:  [phone]  Date:  07/28/2013   ID:  Rose Moody, DOB 03/30/1941, MRN [id number]  PCP:   Duane Lope, MD      History of Present Illness: Rose Moody is a 72 y.o. female with HTN. BPs tend to be in the 120-130s range. SHe has had one reading of 155 systolic in the past few weeks, which is the highest she has seen. Not exercising much. Walks a lot at work. No chest pains of SHOB. Hypertension:  Denies : Chest pain.  Leg edema.  Palpitations.  Shortness of breath.  Syncope.     Wt Readings from Last 3 Encounters:  07/28/13 155 lb (70.308 kg)  12/29/12 150 lb (68.04 kg)  12/08/12 150 lb (68.04 kg)     Past Medical History  Diagnosis Date  . Hypertension   . Anemia   . Thyroid disease   . Varicose veins     Current Outpatient Prescriptions  Medication Sig Dispense Refill  . amLODipine (NORVASC) 5 MG tablet Take 5 mg by mouth every evening.      . Calcium Carbonate-Vitamin D (CALCIUM + D PO) Take 1 tablet by mouth 2 (two) times daily.       . cyanocobalamin (,VITAMIN B-12,) 1000 MCG/ML injection Inject 1,000 mcg into the muscle every 14 (fourteen) days. Given in doctor's office      . diphenhydrAMINE (BENADRYL) 25 mg capsule Take 50 mg by mouth at bedtime.       Marland Kitchen estradiol (ESTRACE) 0.1 MG/GM vaginal cream Place 1 g vaginally once a week. Sundays      . Fexofenadine HCl (ALLEGRA PO) Take 1 tablet by mouth every morning. OTC      . FOLIC ACID PO Take 1 tablet by mouth every evening.      Marland Kitchen levothyroxine (SYNTHROID, LEVOTHROID) 112 MCG tablet Take 112 mcg by mouth daily before breakfast.       . losartan (COZAAR) 100 MG tablet Take 100 mg by mouth every morning.       . Multiple Vitamins-Minerals (CENTRUM SILVER ULTRA WOMENS) TABS Take 1 tablet by mouth every morning.       . Multiple Vitamins-Minerals  (PRESERVISION/LUTEIN PO) Take 1 tablet by mouth 2 (two) times daily.      . Omega-3 Fatty Acids (FISH OIL PO) Take 1 capsule by mouth 2 (two) times daily.      Bertram Gala Glycol-Propyl Glycol (SYSTANE) 0.4-0.3 % SOLN Place 1 drop into both eyes 3 (three) times daily.       . propranolol (INDERAL) 10 MG tablet Take 10 mg by mouth daily as needed. For blood pressure >160      . trimethoprim (TRIMPEX) 100 MG tablet       . Vitamin D, Ergocalciferol, (DRISDOL) 50000 UNITS CAPS Take 50,000 Units by mouth every 7 (seven) days. Wednesday      . Glucosamine-Chondroitin-MSM (TRIPLE FLEX) 500-400-125 MG TABS Take 1 tablet by mouth every evening.        No current facility-administered medications for this visit.   Facility-Administered Medications Ordered in Other Visits  Medication Dose Route Frequency Provider Last Rate Last Dose  . diphenhydrAMINE (BENADRYL) injection 25 mg  25 mg Intravenous Once Everette Rank, MD      . famotidine (PEPCID) 20 mg in sodium chloride 0.9 %  50 mL IVPB  20 mg Intravenous Once Everette Rank, MD        Allergies:    Allergies  Allergen Reactions  . Codeine Nausea And Vomiting  . Contrast Media [Iodinated Diagnostic Agents]     Rash - given during an eye procedure  . Cortisone Other (See Comments)    Body swelling. Has taken other steroids  . Dyclonine     Pt. States she experienced a seizure after taking this medication.  Gaynelle Cage Flavor     unknown  . Toviaz [Fesoterodine Fumarate] Other (See Comments)    Dehydration and confusion  . Zestril [Lisinopril] Cough  . Astelin Rash  . Cefuroxime Axetil Rash  . Ciprofloxacin Rash  . Estradiol Rash  . Zithromax [Azithromycin Dihydrate] Rash  . Zofran Rash    Social History:  The patient  reports that she has never smoked. She has never used smokeless tobacco. She reports that she does not drink alcohol or use illicit drugs.   Family History:  The patient's family history includes Cancer in her mother;  Heart disease in her father and sister; Stroke in her mother.   ROS:  Please see the history of present illness.  No nausea, vomiting.  No fevers, chills.  No focal weakness.  No dysuria.   All other systems reviewed and negative.   PHYSICAL EXAM: VS:  BP 132/64  Pulse 71  Ht 5' 3.75" (1.619 m)  Wt 155 lb (70.308 kg)  BMI 26.82 kg/m2 Well nourished, well developed, in no acute distress HEENT: normal Neck: no JVD, no carotid bruits Cardiac:  normal S1, S2; RRR;  Lungs:  clear to auscultation bilaterally, no wheezing, rhonchi or rales Abd: soft, nontender, no hepatomegaly Ext: no edema Skin: warm and dry Neuro:   no focal abnormalities noted  EKG:  NSR, NSST    ASSESSMENT AND PLAN:  Renal artery stenosis  has had evidence of bilateral renal artery stenosis on prior duplex. No RAS by angiogram. Dual arterial supplies to both kidneys.  2. Essential hypertension, benign  Continue Amlodipine Besylate Tablet, 5 MG, 1 tablet, Orally, Once a day Continue Losartan Potassium Tablet, 100 MG, 1 tablet, Orally, Once a day blood pressure well controlled at home. Try to increase walking to 5 days a week, 30 minutes a day. Low salt diet. [id number]  Renal function reviewed and normal from 11/14 Signed, Fredric Mare, MD, [facility] 07/28/2013 11:59 AM

## 2013-07-28 NOTE — ED Notes (Signed)
Pt took her bedtime Losartan 100mg  at 8:15pm. At 9pm she accidentally took another one thinking she was taking a Benadryl.  She called her cardiologist and was told to come to ED.

## 2013-07-28 NOTE — ED Notes (Signed)
MD at bedside. 

## 2013-07-28 NOTE — Patient Instructions (Signed)
Your physician recommends that you continue on your current medications as directed. Please refer to the Current Medication list given to you today.  Your physician wants you to follow-up in: 1 year with Dr. Varanasi. You will receive a reminder letter in the mail two months in advance. If you don't receive a letter, please call our office to schedule the follow-up appointment.  

## 2013-07-29 NOTE — ED Notes (Signed)
MD at bedside. 

## 2013-09-28 ENCOUNTER — Other Ambulatory Visit: Payer: Self-pay

## 2013-09-28 MED ORDER — LOSARTAN POTASSIUM 100 MG PO TABS: 100.0000 mg | ORAL_TABLET | Freq: Every morning | ORAL | Status: DC

## 2013-09-28 MED ORDER — AMLODIPINE BESYLATE 5 MG PO TABS: 5.0000 mg | ORAL_TABLET | Freq: Every evening | ORAL | Status: DC

## 2013-10-18 ENCOUNTER — Telehealth: Payer: Self-pay | Admitting: Interventional Cardiology

## 2013-10-18 NOTE — Telephone Encounter (Signed)
New message          Pt received something in the mail from [facility]. Pt would like to know if that is something she should take advantage of.

## 2013-10-18 NOTE — Telephone Encounter (Signed)
Left pt a message to call back. 

## 2013-10-20 NOTE — Telephone Encounter (Signed)
Spoke with pt and let know that most of the tests we have already completed. If she has problems we would be happy to order any tests needed and pt agrees.

## 2013-11-02 ENCOUNTER — Telehealth: Payer: Self-pay | Admitting: Interventional Cardiology

## 2013-11-02 NOTE — Telephone Encounter (Signed)
New Message:  Pt states she is returning a call from yesterday. Pt would like to speak w/ Amy.

## 2013-11-02 NOTE — Telephone Encounter (Signed)
Spoke with pt and we are not sure who called her. I told her if I find out I will let her know.

## 2014-01-19 ENCOUNTER — Encounter (INDEPENDENT_AMBULATORY_CARE_PROVIDER_SITE_OTHER): Payer: Self-pay | Admitting: Ophthalmology

## 2014-03-02 ENCOUNTER — Encounter (INDEPENDENT_AMBULATORY_CARE_PROVIDER_SITE_OTHER): Payer: Medicare Other | Admitting: Ophthalmology

## 2014-03-02 DIAGNOSIS — H3530 Unspecified macular degeneration: Secondary | ICD-10-CM

## 2014-03-02 DIAGNOSIS — H35039 Hypertensive retinopathy, unspecified eye: Secondary | ICD-10-CM

## 2014-03-02 DIAGNOSIS — I10 Essential (primary) hypertension: Secondary | ICD-10-CM

## 2014-04-13 ENCOUNTER — Other Ambulatory Visit: Payer: Self-pay

## 2014-04-13 MED ORDER — AMLODIPINE BESYLATE 5 MG PO TABS: 5.0000 mg | ORAL_TABLET | Freq: Every evening | ORAL | Status: DC

## 2014-04-13 MED ORDER — LOSARTAN POTASSIUM 100 MG PO TABS: 100.0000 mg | ORAL_TABLET | Freq: Every morning | ORAL | Status: DC

## 2014-07-23 ENCOUNTER — Telehealth: Payer: Self-pay | Admitting: Interventional Cardiology

## 2014-07-23 ENCOUNTER — Other Ambulatory Visit: Payer: Self-pay | Admitting: *Deleted

## 2014-07-23 MED ORDER — LOSARTAN POTASSIUM 100 MG PO TABS: 100.0000 mg | ORAL_TABLET | Freq: Every morning | ORAL | Status: DC

## 2014-07-23 MED ORDER — AMLODIPINE BESYLATE 5 MG PO TABS: 5.0000 mg | ORAL_TABLET | Freq: Every evening | ORAL | Status: DC

## 2014-07-23 NOTE — Telephone Encounter (Signed)
error 

## 2014-07-26 ENCOUNTER — Encounter (HOSPITAL_COMMUNITY): Payer: Self-pay | Admitting: Interventional Cardiology

## 2014-08-02 ENCOUNTER — Encounter: Payer: Self-pay | Admitting: Interventional Cardiology

## 2014-08-02 ENCOUNTER — Ambulatory Visit (INDEPENDENT_AMBULATORY_CARE_PROVIDER_SITE_OTHER): Payer: Medicare Other | Admitting: Interventional Cardiology

## 2014-08-02 VITALS — BP 138/72 | HR 72 | Ht 63.0 in | Wt 150.0 lb

## 2014-08-02 DIAGNOSIS — I10 Essential (primary) hypertension: Secondary | ICD-10-CM

## 2014-08-02 NOTE — Patient Instructions (Signed)
Your physician recommends that you continue on your current medications as directed. Please refer to the Current Medication list given to you today.   Your physician recommends that you schedule a follow-up appointment as needed  

## 2014-08-02 NOTE — Progress Notes (Signed)
Patient ID: Rose Moody, female   DOB: 28-Dec-1940, 73 y.o.   MRN: [id number] Patient ID: Rose Moody, female   DOB: [birth year], 73 y.o.   MRN: [id number]    [place] [place], [place]  [address] Phone: [phone] Fax:  [phone]  Date:  08/02/2014   ID:  Rose Moody, DOB Jul 24, 1941, MRN [id number]  PCP:   [name], MD      History of Present Illness: Rose Moody is a 73 y.o. female with HTN. BPs tend to be in the 120-130s range. SHe has had one reading of 097 systolic in the past few weeks, which is the highest she has seen. Not exercising much. Walks a lot at work- this is her most strenuous activity. No chest pains of SHOB.  Contemplating retirement.  Hypertension:  Denies : Chest pain.  Leg edema.  Palpitations.  Shortness of breath.  Syncope.   Most likely will retire at the end of the month.  Wants to become more active physically.  Wt Readings from Last 3 Encounters:  08/02/14 150 lb (68.04 kg)  07/28/13 155 lb (70.308 kg)  07/28/13 155 lb (70.308 kg)     Past Medical History  Diagnosis Date  . Hypertension   . Anemia   . Thyroid disease   . Varicose veins   . UTI (lower urinary tract infection)     Current Outpatient Prescriptions  Medication Sig Dispense Refill  . amLODipine (NORVASC) 5 MG tablet Take 1 tablet (5 mg total) by mouth every evening. 90 tablet 0  . amoxicillin (AMOXIL) 500 MG capsule Take 500 mg by mouth 2 (two) times daily. Taking three 500mg  tabs twice daily.  0  . Calcium Carbonate-Vitamin D (CALCIUM + D PO) Take 1 tablet by mouth 2 (two) times daily.     . cyanocobalamin (,VITAMIN B-12,) 1000 MCG/ML injection Inject 1,000 mcg into the muscle every 14 (fourteen) days. Given in doctor's office    . diphenhydrAMINE (BENADRYL) 25 mg capsule Take 50 mg by mouth at bedtime.     Marland Kitchen estradiol (ESTRACE) 0.1 MG/GM vaginal cream Place 1 g vaginally once a week. Sundays    . Fexofenadine HCl (ALLEGRA PO) Take 1 tablet by mouth every  morning. OTC    . FOLIC ACID PO Take 1 tablet by mouth every evening.    Marland Kitchen levothyroxine (SYNTHROID, LEVOTHROID) 112 MCG tablet Take 112 mcg by mouth daily before breakfast.     . losartan (COZAAR) 100 MG tablet Take 1 tablet (100 mg total) by mouth every morning. 90 tablet 0  . Multiple Vitamins-Minerals (CENTRUM SILVER ULTRA WOMENS) TABS Take 1 tablet by mouth every morning.     . Multiple Vitamins-Minerals (PRESERVISION/LUTEIN PO) Take 1 tablet by mouth 2 (two) times daily.    . Omega-3 Fatty Acids (FISH OIL PO) Take 1 capsule by mouth 2 (two) times daily.     No current facility-administered medications for this visit.   Facility-Administered Medications Ordered in Other Visits  Medication Dose Route Frequency Provider Last Rate Last Dose  . diphenhydrAMINE (BENADRYL) injection 25 mg  25 mg Intravenous Once Jettie Booze, MD      . famotidine (PEPCID) 20 mg in sodium chloride 0.9 % 50 mL IVPB  20 mg Intravenous Once Jettie Booze, MD        Allergies:    Allergies  Allergen Reactions  . Codeine Nausea And Vomiting  . Contrast Media [Iodinated Diagnostic Agents]     Rash -  given during an eye procedure  . Cortisone Other (See Comments)    Body swelling. Has taken other steroids  . Dyclonine     Pt. States she experienced a seizure after taking this medication.  Rolinda Roan Flavor     unknown  . Toviaz [Fesoterodine Fumarate] Other (See Comments)    Dehydration and confusion  . Zestril [Lisinopril] Cough  . Astelin Rash  . Cefuroxime Axetil Rash  . Ciprofloxacin Rash  . Estradiol Rash  . Zithromax [Azithromycin Dihydrate] Rash  . Zofran Rash    Social History:  The patient  reports that she has never smoked. She has never used smokeless tobacco. She reports that she does not drink alcohol or use illicit drugs.   Family History:  The patient's family history includes Cancer in her mother; Heart disease in her father and sister; Stroke in her mother.    ROS:  Please see the history of present illness.  No nausea, vomiting.  No fevers, chills.  No focal weakness.  No dysuria.   All other systems reviewed and negative.   PHYSICAL EXAM: VS:  BP 138/72 mmHg  Pulse 72  Ht 5\' 3"  (1.6 m)  Wt 150 lb (68.04 kg)  BMI 26.58 kg/m2 Well nourished, well developed, in no acute distress HEENT: normal Neck: no JVD, no carotid bruits Cardiac:  normal S1, S2; RRR;  Lungs:  clear to auscultation bilaterally, no wheezing, rhonchi or rales Abd: soft, nontender, no hepatomegaly Ext: no edema Skin: warm and dry Neuro:   no focal abnormalities noted Psych: normal affect  EKG:  NSR, NSST    ASSESSMENT AND PLAN:  Renal artery stenosis  has had evidence of bilateral renal artery stenosis on prior duplex. No RAS by angiogram. Dual arterial supplies to both kidneys.  2. Essential hypertension, benign       Continue Amlodipine Besylate Tablet, 5 MG, 1 tablet, Orally, Once a day Continue Losartan Potassium Tablet, 100 MG, 1 tablet, Orally, Once a day blood pressure well controlled at home. Try to increase walking to 5 days a week, 30 minutes a day. Low salt diet.   Renal function reviewed and normal from 11/14; 2015 labs not available to [place] but were normal per her report. Signed, Mina Marble, MD, [facility] 08/02/2014 4:24 PM

## 2014-08-17 HISTORY — PX: OTHER SURGICAL HISTORY: SHX169

## 2014-09-24 ENCOUNTER — Telehealth: Payer: Self-pay | Admitting: Interventional Cardiology

## 2014-09-24 NOTE — Telephone Encounter (Signed)
New message       Pt c/o medication issue:  1. Name of Medication: amlodipine  2. How are you currently taking this medication (dosage and times per day)? 5mg  at night 3. Are you having a reaction (difficulty breathing--STAT)?no  4. What is your medication issue?took pm medication this am-----pt says her bp reading at 12:00 was 154/102 pulse 71.  Prior to taking the amlodipine--her bp was 133-84 and pulse 83.  Please advise

## 2014-09-24 NOTE — Telephone Encounter (Signed)
I called and spoke with the patient. She reports her BP this morning was 133/84 HR- 83, then a recheck was 113/78, then checked again around noon 154/102 HR-71. She rechecked her BP again shortly after 1:00 PM and she was 130/79 HR- 85. She reports she usually takes her amlodipine at night and her losartan in the morning. She got this reversed this morning and was wanting to know what to do with her medications. She took amlodipine at 7:30 AM. I have advised her to go ahead and take her losartan now and then she can take it again in the morning and take her amlodipine tomorrow night. This should get her back on track. She voices understanding.

## 2015-01-16 ENCOUNTER — Encounter (INDEPENDENT_AMBULATORY_CARE_PROVIDER_SITE_OTHER): Payer: Medicare Other | Admitting: Ophthalmology

## 2015-01-16 DIAGNOSIS — H35033 Hypertensive retinopathy, bilateral: Secondary | ICD-10-CM

## 2015-01-16 DIAGNOSIS — I10 Essential (primary) hypertension: Secondary | ICD-10-CM | POA: Diagnosis not present

## 2015-01-16 DIAGNOSIS — H33012 Retinal detachment with single break, left eye: Secondary | ICD-10-CM | POA: Diagnosis not present

## 2015-01-16 DIAGNOSIS — H3531 Nonexudative age-related macular degeneration: Secondary | ICD-10-CM

## 2015-01-16 DIAGNOSIS — H43813 Vitreous degeneration, bilateral: Secondary | ICD-10-CM | POA: Diagnosis not present

## 2015-01-23 ENCOUNTER — Ambulatory Visit (INDEPENDENT_AMBULATORY_CARE_PROVIDER_SITE_OTHER): Payer: Medicare Other | Admitting: Ophthalmology

## 2015-01-23 DIAGNOSIS — H33302 Unspecified retinal break, left eye: Secondary | ICD-10-CM

## 2015-05-15 ENCOUNTER — Other Ambulatory Visit: Payer: Self-pay | Admitting: *Deleted

## 2015-05-15 DIAGNOSIS — I83892 Varicose veins of left lower extremities with other complications: Secondary | ICD-10-CM

## 2015-05-27 ENCOUNTER — Ambulatory Visit (INDEPENDENT_AMBULATORY_CARE_PROVIDER_SITE_OTHER): Payer: Medicare Other | Admitting: Ophthalmology

## 2015-05-27 DIAGNOSIS — H33302 Unspecified retinal break, left eye: Secondary | ICD-10-CM

## 2015-05-27 DIAGNOSIS — H43813 Vitreous degeneration, bilateral: Secondary | ICD-10-CM | POA: Diagnosis not present

## 2015-05-27 DIAGNOSIS — H353132 Nonexudative age-related macular degeneration, bilateral, intermediate dry stage: Secondary | ICD-10-CM | POA: Diagnosis not present

## 2015-05-27 DIAGNOSIS — H35033 Hypertensive retinopathy, bilateral: Secondary | ICD-10-CM

## 2015-05-27 DIAGNOSIS — I10 Essential (primary) hypertension: Secondary | ICD-10-CM | POA: Diagnosis not present

## 2015-06-06 ENCOUNTER — Ambulatory Visit (HOSPITAL_COMMUNITY)
Admission: RE | Admit: 2015-06-06 | Discharge: 2015-06-06 | Disposition: A | Discharge status: Home / Self Care | Payer: Medicare Other | Source: Ambulatory Visit | Attending: Vascular Surgery | Admitting: Vascular Surgery

## 2015-06-06 DIAGNOSIS — I83892 Varicose veins of left lower extremities with other complications: Secondary | ICD-10-CM | POA: Insufficient documentation

## 2015-06-06 DIAGNOSIS — I10 Essential (primary) hypertension: Secondary | ICD-10-CM | POA: Diagnosis not present

## 2015-06-11 ENCOUNTER — Ambulatory Visit: Payer: Self-pay | Admitting: Vascular Surgery

## 2015-06-13 ENCOUNTER — Encounter: Payer: Self-pay | Admitting: Vascular Surgery

## 2015-06-18 ENCOUNTER — Ambulatory Visit (INDEPENDENT_AMBULATORY_CARE_PROVIDER_SITE_OTHER): Payer: Medicare Other | Admitting: Vascular Surgery

## 2015-06-18 ENCOUNTER — Encounter: Payer: Self-pay | Admitting: Vascular Surgery

## 2015-06-18 VITALS — BP 118/64 | HR 62 | Temp 98.0°F | Resp 18 | Ht 63.0 in | Wt 148.1 lb

## 2015-06-18 DIAGNOSIS — I87302 Chronic venous hypertension (idiopathic) without complications of left lower extremity: Secondary | ICD-10-CM | POA: Diagnosis not present

## 2015-06-18 NOTE — Progress Notes (Signed)
Patient name: Rose Moody MRN: [id number] DOB: 11-28-1940 Sex: female     Reason for referral:  Chief Complaint  Patient presents with  . Follow-up    s/p stab phlebectomy right leg >20 incisions 12-08-2012  ,  currently painful varicosities behind left knee  . Varicose Veins    HISTORY OF PRESENT ILLNESS: The patient is well-known to me from prior treatment with stab phlebectomy of her right leg varicosities. She did have a history of bleeding around her ankle from venous hypertension in the right leg in the past. She presents now complaining concern regarding varicosities in her left popliteal space. She does have pain around her left knee which seems to be more related to arthritis  Past Medical History  Diagnosis Date  . Hypertension   . Anemia   . Thyroid disease   . Varicose veins   . UTI (lower urinary tract infection)   . Detached retina     Past Surgical History  Procedure Laterality Date  . Abdominal hysterectomy  age 34  . Appendectomy  age 67  . Bladder suspension  age 23  . Stab phlebectomy Right 12-08-2012    > 20 incisions by Curt Jews MD  . Renal angiogram N/A 01/28/2012    Procedure: RENAL ANGIOGRAM;  Surgeon: Jettie Booze, MD;  Location: [facility] CATH LAB;  Service: Cardiovascular;  Laterality: N/A;  . Laser surgery for retinal detachment Left 2016    Dr. Zigmund Daniel    Social History   Social History  . Marital Status: Divorced    Spouse Name: N/A  . Number of Children: N/A  . Years of Education: N/A   Occupational History  . Not on file.   Social History Main Topics  . Smoking status: Never Smoker   . Smokeless tobacco: Never Used  . Alcohol Use: No  . Drug Use: No  . Sexual Activity: Not on file   Other Topics Concern  . Not on file   Social History Narrative    Family History  Problem Relation Age of Onset  . Cancer Mother     leukemia  . Stroke Mother   . Heart disease Father   . Heart disease Sister     Allergies as  of 06/18/2015 - Review Complete 06/18/2015  Allergen Reaction Noted  . Codeine Nausea And Vomiting 04/07/2011  . Contrast media [iodinated diagnostic agents]  01/27/2012  . Cortisone Other (See Comments) 04/07/2011  . Dyclonine  05/13/2011  . Tequila sunrise flavor  04/07/2011  . Toviaz [fesoterodine fumarate] Other (See Comments) 04/07/2011  . Zestril [lisinopril] Cough 04/07/2011  . Astelin Rash 04/07/2011  . Cefuroxime axetil Rash 04/07/2011  . Ciprofloxacin Rash 01/27/2012  . Estradiol Rash 01/27/2012  . Zithromax [azithromycin dihydrate] Rash 04/07/2011  . Zofran Rash 04/07/2011    Current Outpatient Prescriptions on File Prior to Visit  Medication Sig Dispense Refill  . amLODipine (NORVASC) 5 MG tablet Take 1 tablet (5 mg total) by mouth every evening. 90 tablet 0  . cyanocobalamin (,VITAMIN B-12,) 1000 MCG/ML injection Inject 1,000 mcg into the muscle every 14 (fourteen) days. Given in doctor's office    . diphenhydrAMINE (BENADRYL) 25 mg capsule Take 50 mg by mouth at bedtime.     Marland Kitchen estradiol (ESTRACE) 0.1 MG/GM vaginal cream Place 1 g vaginally once a week. Sundays    . Fexofenadine HCl (ALLEGRA PO) Take 1 tablet by mouth every morning. OTC    . FOLIC ACID PO Take 1 tablet  by mouth every evening.    Marland Kitchen levothyroxine (SYNTHROID, LEVOTHROID) 112 MCG tablet Take 112 mcg by mouth daily before breakfast.     . losartan (COZAAR) 100 MG tablet Take 1 tablet (100 mg total) by mouth every morning. 90 tablet 0  . Multiple Vitamins-Minerals (CENTRUM SILVER ULTRA WOMENS) TABS Take 1 tablet by mouth every morning.     . Multiple Vitamins-Minerals (PRESERVISION/LUTEIN PO) Take 1 tablet by mouth 2 (two) times daily.    . Omega-3 Fatty Acids (FISH OIL PO) Take 1 capsule by mouth 2 (two) times daily.    Marland Kitchen amoxicillin (AMOXIL) 500 MG capsule Take 500 mg by mouth 2 (two) times daily. Taking three 500mg  tabs twice daily.  0  . Calcium Carbonate-Vitamin D (CALCIUM + D PO) Take 1 tablet by mouth 2  (two) times daily.      Current Facility-Administered Medications on File Prior to Visit  Medication Dose Route Frequency Provider Last Rate Last Dose  . diphenhydrAMINE (BENADRYL) injection 25 mg  25 mg Intravenous Once Jettie Booze, MD      . famotidine (PEPCID) 20 mg in sodium chloride 0.9 % 50 mL IVPB  20 mg Intravenous Once Jettie Booze, MD         REVIEW OF SYSTEMS: No change from prior visits  PHYSICAL EXAMINATION:  General: The patient is a well-nourished female, in no acute distress. Vital signs are BP 118/64 mmHg  Pulse 62  Temp(Src) 98 F (36.7 C) (Oral)  Resp 18  Ht 5\' 3"  (1.6 m)  Wt 148 lb 1.6 oz (67.178 kg)  BMI 26.24 kg/m2  SpO2 98% Pulmonary: There is a good air exchange . Musculoskeletal: There are no major deformities.  There is no significant extremity pain. Neurologic: No focal weakness or paresthesias are detected, Skin: Does have Nikolas Casher changes of hemosiderin deposit around her ankles bilaterally scattered telangiectasia and varicosities. Psychiatric: The patient has normal affect. Cardiovascular: Normal dorsalis pedis pulses bilaterally Does have a superficial telangiectasia which are slightly raised above the skin in her posterior popliteal fossa.   [facility] Vascular Lab Studies:  Ordered and Independently Reviewed her study from 06/06/2015 was reviewed. This shows no evidence of DVT in her left leg she does have some mild reflux in her great saphenous vein with no enlargement. Small saphenous vein has more reflux with slight enlargement does have deep reflux in her left common femoral vein, femoral vein and popliteal vein.  Impression and Plan:  Had long discussion with patient. Feel that she would have minimal benefit from ablation of her small saphenous vein. She does have extensive deep venous reflux which I believe is causing the majority of her venous hypertension. I did again stress the importance of elevation compression. She has not had  any bleeding from these superficial telangiectasia in her popliteal fossa. I would not recommend sclerotherapy of these unless she had bleeding complications. These could be treated with sclerotherapy if this occur. She was reassured this discussion will see [place] in as-needed basis    Trevaun Rendleman [facility] [place] Office: [phone]

## 2015-08-30 ENCOUNTER — Encounter (INDEPENDENT_AMBULATORY_CARE_PROVIDER_SITE_OTHER): Payer: Medicare Other | Admitting: Ophthalmology

## 2015-08-30 DIAGNOSIS — H43813 Vitreous degeneration, bilateral: Secondary | ICD-10-CM | POA: Diagnosis not present

## 2015-08-30 DIAGNOSIS — H353132 Nonexudative age-related macular degeneration, bilateral, intermediate dry stage: Secondary | ICD-10-CM | POA: Diagnosis not present

## 2015-08-30 DIAGNOSIS — I10 Essential (primary) hypertension: Secondary | ICD-10-CM | POA: Diagnosis not present

## 2015-08-30 DIAGNOSIS — H35033 Hypertensive retinopathy, bilateral: Secondary | ICD-10-CM

## 2015-08-30 DIAGNOSIS — H33302 Unspecified retinal break, left eye: Secondary | ICD-10-CM | POA: Diagnosis not present

## 2015-09-26 ENCOUNTER — Encounter: Payer: Self-pay | Admitting: *Deleted

## 2015-09-30 ENCOUNTER — Encounter: Payer: Self-pay | Admitting: *Deleted

## 2015-10-02 ENCOUNTER — Ambulatory Visit: Payer: Medicare Other | Admitting: *Deleted

## 2015-10-02 DIAGNOSIS — I83812 Varicose veins of left lower extremities with pain: Secondary | ICD-10-CM

## 2015-10-02 NOTE — Progress Notes (Signed)
The patient came in today for sclerotherapy but then started complaining of increased pain in her left calf in the back and along the outer side where bulges are present. She had a reflux study done 06/06/15 which demonstrated reflux. At that time she was mostly concerned with a few large spider veins that bulge. She was afraid they could bleed like ones that had in her right leg a few years ago. Back last fall, Dr. Donnetta Hutching thought sclerotherapy would be the best treatment of those spider veins. Today, she is more concerned about the pain in her calf. She would like to see Dr. Donnetta Hutching again to discuss laser ablation of her L SSV with stab phlebectomies of the large varicose veins and sclerotherapy of the smaller bulging spider veins. She has been wearing thigh high graduated compression stockings, 20-30 mmHg since seeing Dr. Donnetta Hutching back in October. She has also been elevating her feet when able and taking Ibuprofen for pain relief when elevation does not ease the pain. She will be starting a job where she will be standing most of the day, and she wants to address her venous hypertension before her condition worsens. We have made an appt to see Dr. Donnetta Hutching 10/10/15.

## 2015-10-04 ENCOUNTER — Encounter: Payer: Self-pay | Admitting: Vascular Surgery

## 2015-10-10 ENCOUNTER — Ambulatory Visit (INDEPENDENT_AMBULATORY_CARE_PROVIDER_SITE_OTHER): Payer: Medicare Other | Admitting: Vascular Surgery

## 2015-10-10 ENCOUNTER — Encounter: Payer: Self-pay | Admitting: Vascular Surgery

## 2015-10-10 VITALS — BP 131/70 | HR 76 | Temp 97.2°F | Resp 14 | Ht 63.0 in | Wt 149.0 lb

## 2015-10-10 DIAGNOSIS — I83812 Varicose veins of left lower extremities with pain: Secondary | ICD-10-CM | POA: Diagnosis not present

## 2015-10-10 NOTE — Progress Notes (Signed)
[facility] [place]  Patient name: Rose Moody MRN: [id number] DOB: Apr 07, 1941 Sex: female  REASON FOR VISIT: Continued follow-up of painful left leg venous hypertension  HPI: Rose Moody is a 74 y.o. female well-known to me from prior right great saphenous vein ablation. She does have known reflux in her left small saphenous vein. She reports that she is having increasingly severe discomfort much more so on her left leg. Her right leg has minimal discomfort. She reports that there is no discomfort at the end the morning but over the course of a continued being on her feet all day she has aching in her posterior left calf. She does have marked telangiectasia associated with this and several are raised and very superficial. The risk for hemorrhage. She did have hemorrhage from similar veins in her right leg but has not had hemorrhage t on the left leg.  Past Medical History  Diagnosis Date  . Hypertension   . Anemia   . Thyroid disease   . Varicose veins   . UTI (lower urinary tract infection)   . Detached retina     Family History  Problem Relation Age of Onset  . Cancer Mother     leukemia  . Stroke Mother   . Heart disease Father   . Heart disease Sister     SOCIAL HISTORY: Social History  Substance Use Topics  . Smoking status: Never Smoker   . Smokeless tobacco: Never Used  . Alcohol Use: No    Allergies  Allergen Reactions  . Codeine Nausea And Vomiting  . Contrast Media [Iodinated Diagnostic Agents]     Rash - given during an eye procedure  . Cortisone Other (See Comments)    Body swelling. Has taken other steroids  . Dyclonine     Pt. States she experienced a seizure after taking this medication.  Rolinda Roan Flavor     unknown  . Toviaz [Fesoterodine Fumarate] Other (See Comments)    Dehydration and confusion  . Zestril [Lisinopril] Cough  . Astelin Rash  . Cefuroxime Axetil Rash  . Ciprofloxacin Rash  . Estradiol Rash  .  Zithromax [Azithromycin Dihydrate] Rash  . Zofran Rash    Current Outpatient Prescriptions  Medication Sig Dispense Refill  . amLODipine (NORVASC) 5 MG tablet Take 1 tablet (5 mg total) by mouth every evening. 90 tablet 0  . cyanocobalamin (,VITAMIN B-12,) 1000 MCG/ML injection Inject 1,000 mcg into the muscle every 14 (fourteen) days. Given in doctor's office    . diphenhydrAMINE (BENADRYL) 25 mg capsule Take 50 mg by mouth at bedtime.     Marland Kitchen estradiol (ESTRACE) 0.1 MG/GM vaginal cream Place 1 g vaginally once a week. Sundays    . Fexofenadine HCl (ALLEGRA PO) Take 1 tablet by mouth every morning. OTC    . levothyroxine (SYNTHROID, LEVOTHROID) 112 MCG tablet Take 112 mcg by mouth daily before breakfast.     . losartan (COZAAR) 100 MG tablet Take 1 tablet (100 mg total) by mouth every morning. 90 tablet 0  . Multiple Vitamins-Minerals (CENTRUM SILVER ULTRA WOMENS) TABS Take 1 tablet by mouth every morning.     . Multiple Vitamins-Minerals (PRESERVISION/LUTEIN PO) Take 1 tablet by mouth 2 (two) times daily.    . Omega-3 Fatty Acids (FISH OIL PO) Take 1 capsule by mouth 2 (two) times daily.    Marland Kitchen amoxicillin (AMOXIL) 500 MG capsule Take 500 mg by mouth 2 (two) times daily. Reported on 10/10/2015  0  .  Calcium Carbonate-Vitamin D (CALCIUM + D PO) Take 1 tablet by mouth 2 (two) times daily. Reported on 10/10/2015    . FOLIC ACID PO Take 1 tablet by mouth every evening. Reported on 10/10/2015     No current facility-administered medications for this visit.   Facility-Administered Medications Ordered in Other Visits  Medication Dose Route Frequency Provider Last Rate Last Dose  . diphenhydrAMINE (BENADRYL) injection 25 mg  25 mg Intravenous Once Jettie Booze, MD      . famotidine (PEPCID) 20 mg in sodium chloride 0.9 % 50 mL IVPB  20 mg Intravenous Once Jettie Booze, MD        REVIEW OF SYSTEMS:  [X]  denotes positive finding, [ ]  denotes negative finding Cardiac  Comments:  Chest  pain or chest pressure:    Shortness of breath upon exertion:    Short of breath when lying flat:    Irregular heart rhythm:        Vascular    Pain in calf, thigh, or hip brought on by ambulation:    Pain in feet at night that wakes you up from your sleep:     Blood clot in your veins:    Leg swelling:         Pulmonary    Oxygen at home:    Productive cough:     Wheezing:         Neurologic    Sudden weakness in arms or legs:     Sudden numbness in arms or legs:     Sudden onset of difficulty speaking or slurred speech:    Temporary loss of vision in one eye:     Problems with dizziness:         Gastrointestinal    Blood in stool:     Vomited blood:         Genitourinary    Burning when urinating:     Blood in urine:        Psychiatric    Major depression:         Hematologic    Bleeding problems:    Problems with blood clotting too easily:        Skin    Rashes or ulcers:        Constitutional    Fever or chills:      PHYSICAL EXAM: Filed Vitals:   10/10/15 1028  BP: 131/70  Pulse: 76  Temp: 97.2 F (36.2 C)  Resp: 14  Height: 5\' 3"  (1.6 m)  Weight: 149 lb (67.586 kg)  SpO2: 97%    GENERAL: The patient is a well-nourished female, in no acute distress. The vital signs are documented above. 2+ dorsalis pedis pulses bilaterally PULMONARY: There is good air exchange   MUSCULOSKELETAL: There are no major deformities or cyanosis. NEUROLOGIC: No focal weakness or paresthesias are detected. SKIN: There are no ulcers or rashes noted. Telangiectasia bilaterally PSYCHIATRIC: The patient has a normal affect.  DATA:  Duplex has shown reflux and enlargement in her small saphenous vein on the left. I reimage this out with SonoSite ultrasound today. She does have enlargement and reflux. Small saphenous vein on the right leg is very small caliber.  MEDICAL ISSUES: Symptomatic venous hypertension related to left small saphenous vein reflux. Have recommended  laser ablation of her small saphenous vein and sclerotherapy of telangiectasia for reduction of pain and reduction of risk for hemorrhage. She remembers this from her prior right leg treatment several years ago.  Understands this as an outpatient procedure under local anesthesia also understands slight risk of DVT associated with the procedure No Follow-up on file.   Curt Jews [facility] [place]: [phone]

## 2015-10-15 ENCOUNTER — Other Ambulatory Visit: Payer: Self-pay | Admitting: *Deleted

## 2015-10-15 DIAGNOSIS — I83812 Varicose veins of left lower extremities with pain: Secondary | ICD-10-CM

## 2015-11-15 ENCOUNTER — Encounter: Payer: Self-pay | Admitting: Vascular Surgery

## 2015-11-20 ENCOUNTER — Other Ambulatory Visit: Payer: Self-pay | Admitting: *Deleted

## 2015-11-20 DIAGNOSIS — I83812 Varicose veins of left lower extremities with pain: Secondary | ICD-10-CM

## 2015-11-21 ENCOUNTER — Other Ambulatory Visit: Payer: Medicare Other | Admitting: Vascular Surgery

## 2015-12-03 ENCOUNTER — Inpatient Hospital Stay (HOSPITAL_COMMUNITY)
Admission: RE | Admit: 2015-12-03 | Discharge: 2015-12-03 | Disposition: A | Discharge status: Home / Self Care | Payer: Medicare Other | Source: Ambulatory Visit | Attending: Vascular Surgery | Admitting: Vascular Surgery

## 2015-12-03 ENCOUNTER — Ambulatory Visit: Payer: Medicare Other | Admitting: Vascular Surgery

## 2015-12-03 DIAGNOSIS — I83812 Varicose veins of left lower extremities with pain: Secondary | ICD-10-CM

## 2015-12-25 ENCOUNTER — Ambulatory Visit: Payer: Medicare Other | Admitting: *Deleted

## 2016-01-08 ENCOUNTER — Encounter: Payer: Self-pay | Admitting: Vascular Surgery

## 2016-01-16 ENCOUNTER — Encounter: Payer: Self-pay | Admitting: Vascular Surgery

## 2016-01-16 ENCOUNTER — Ambulatory Visit (INDEPENDENT_AMBULATORY_CARE_PROVIDER_SITE_OTHER): Payer: Medicare Other | Admitting: Vascular Surgery

## 2016-01-16 VITALS — BP 129/66 | HR 66 | Temp 97.1°F | Resp 16 | Ht 63.0 in | Wt 149.0 lb

## 2016-01-16 DIAGNOSIS — I83812 Varicose veins of left lower extremities with pain: Secondary | ICD-10-CM | POA: Diagnosis not present

## 2016-01-16 HISTORY — PX: ENDOVENOUS ABLATION SAPHENOUS VEIN W/ LASER: SUR449

## 2016-01-16 NOTE — Progress Notes (Signed)
     Laser Ablation Procedure    Date: 01/16/2016   Rose Moody DOB:1941/03/26  Consent signed: Yes    Surgeon:  Dr. Sherren Mocha Amanie Mcculley  Procedure: Laser Ablation: left Small Saphenous Vein  BP 129/66 mmHg  Pulse 66  Temp(Src) 97.1 F (36.2 C) (Oral)  Resp 16  Ht 5\' 3"  (1.6 m)  Wt 149 lb (67.586 kg)  BMI 26.40 kg/m2  SpO2 97%  Tumescent Anesthesia: 350 cc 0.9% NaCl with 50 cc Lidocaine HCL with 1% Epi and 15 cc 8.4% NaHCO3  Local Anesthesia: 3 cc Lidocaine HCL and NaHCO3 (ratio 2:1)  15 watts continuous mode        Total energy: 1422 Joules   Total time: 1:35  Sclerotherapy: 0.3 %Sotradecol. Patient received a total of 3 cc    Patient tolerated procedure well    Description of Procedure:  After marking the course of the secondary varicosities, the patient was placed on the operating table in the prone position, and the left leg was prepped and draped in sterile fashion.   Local anesthetic was administered and under ultrasound guidance the saphenous vein was accessed with a micro needle and guide wire; then the mirco puncture sheath was placed.  A guide wire was inserted saphenopopliteal junction , followed by a 5 french sheath.  The position of the sheath and then the laser fiber below the junction was confirmed using the ultrasound.  Tumescent anesthesia was administered along the course of the saphenous vein using ultrasound guidance. The patient was placed in Trendelenburg position and protective laser glasses were placed on patient and staff, and the laser was fired at 15 watts continuous mode advancing 1-64mm/second for a total of 1422 joules.     Sclerotherapy was performed to 2 varicosities using 3  cc .3% Sotradecol foam via a 30 gauge needle.  Steri strips were applied  and ABD pads and thigh high compression stockings were applied.  Ace wrap bandages were applied  at the top of the saphenopopliteal junction. Blood loss was less than 15 cc.  The patient ambulated out of the  operating room having tolerated the procedure well.  Uneventful ablation small saphenous vein from above the ankle to just below the saphenous popliteal junction. Also had sclerotherapy of several large prominent telangiectasia in the popliteal fossa

## 2016-01-17 ENCOUNTER — Telehealth: Payer: Self-pay | Admitting: *Deleted

## 2016-01-17 NOTE — Telephone Encounter (Signed)
    01/17/2016  Time: 9:35 AM   Patient Name: Rose Moody  Patient of: T.F. Early  Procedure:Laser Ablation and Sclerotherapy left leg  (left small saphenous vein was treated with laser ablation) 01-16-2016    Reached patient at home and checked  Her status  Yes    Comments/Actions Taken: [name] states she slept well last night.  States this morning she has moderate pain left posterior calf and behind left knee (area treated with laser ablation).  She is unable to take Ibuprofen due to medical concerns so recommended that she take extra strength Tylenol (every 4-6 hours) as needed  for left leg pain, keep the left leg elevated when sitting, and use ice compress to left posterior calf as need for left leg pain. Reviewed post procedural instructions with her and reminded her of post LA duplex and VV follow up with Dr. Donnetta Hutching on 01-30-2016.         @SIGNATURE @

## 2016-01-22 ENCOUNTER — Encounter (INDEPENDENT_AMBULATORY_CARE_PROVIDER_SITE_OTHER): Payer: Medicare Other

## 2016-01-22 DIAGNOSIS — I87302 Chronic venous hypertension (idiopathic) without complications of left lower extremity: Secondary | ICD-10-CM

## 2016-01-23 ENCOUNTER — Encounter: Payer: Self-pay | Admitting: Vascular Surgery

## 2016-01-30 ENCOUNTER — Ambulatory Visit (INDEPENDENT_AMBULATORY_CARE_PROVIDER_SITE_OTHER): Payer: Medicare Other | Admitting: Vascular Surgery

## 2016-01-30 ENCOUNTER — Encounter: Payer: Self-pay | Admitting: Vascular Surgery

## 2016-01-30 ENCOUNTER — Ambulatory Visit (HOSPITAL_COMMUNITY)
Admission: RE | Admit: 2016-01-30 | Discharge: 2016-01-30 | Disposition: A | Discharge status: Home / Self Care | Payer: Medicare Other | Source: Ambulatory Visit | Attending: Vascular Surgery | Admitting: Vascular Surgery

## 2016-01-30 VITALS — BP 136/74 | HR 68 | Ht 63.0 in | Wt 152.7 lb

## 2016-01-30 DIAGNOSIS — I10 Essential (primary) hypertension: Secondary | ICD-10-CM | POA: Insufficient documentation

## 2016-01-30 DIAGNOSIS — I83812 Varicose veins of left lower extremities with pain: Secondary | ICD-10-CM

## 2016-01-30 DIAGNOSIS — Z9889 Other specified postprocedural states: Secondary | ICD-10-CM | POA: Insufficient documentation

## 2016-01-30 NOTE — Progress Notes (Signed)
[facility]  Patient name: Rose Moody MRN: [id number] DOB: 1940/10/26 Sex: female  REASON FOR VISIT: Follow-up laser ablation left small saphenous vein on 01/16/2016  HPI: Rose Moody is a 75 y.o. female here today for follow-up. She did have moderate amount of discomfort over the ablated small saphenous vein for the first 10 days reports that this is now improving. She has been very compliant with her compression garments.  Past Medical History  Diagnosis Date  . Hypertension   . Anemia   . Thyroid disease   . Varicose veins   . UTI (lower urinary tract infection)   . Detached retina     Family History  Problem Relation Age of Onset  . Cancer Mother     leukemia  . Stroke Mother   . Heart disease Father   . Heart disease Sister     SOCIAL HISTORY: Social History  Substance Use Topics  . Smoking status: Never Smoker   . Smokeless tobacco: Never Used  . Alcohol Use: No    Allergies  Allergen Reactions  . Codeine Nausea And Vomiting  . Contrast Media [Iodinated Diagnostic Agents]     Rash - given during an eye procedure  . Cortisone Other (See Comments)    Body swelling. Has taken other steroids  . Dyclonine     Pt. States she experienced a seizure after taking this medication.  Rolinda Roan Flavor     unknown  . Toviaz [Fesoterodine Fumarate] Other (See Comments)    Dehydration and confusion  . Zestril [Lisinopril] Cough  . Astelin Rash  . Cefuroxime Axetil Rash  . Ciprofloxacin Rash  . Estradiol Rash  . Zithromax [Azithromycin Dihydrate] Rash  . Zofran Rash    Current Outpatient Prescriptions  Medication Sig Dispense Refill  . amLODipine (NORVASC) 5 MG tablet Take 1 tablet (5 mg total) by mouth every evening. 90 tablet 0  . amoxicillin (AMOXIL) 500 MG capsule Take 500 mg by mouth 2 (two) times daily. Reported on 01/16/2016  0  . Calcium Carbonate-Vitamin D (CALCIUM + D PO) Take 1  tablet by mouth 2 (two) times daily. Reported on 01/16/2016    . cephALEXin (KEFLEX) 250 MG capsule Take by mouth daily.    . cetirizine (ZYRTEC) 10 MG tablet Take 10 mg by mouth daily.    . Cholecalciferol (VITAMIN D HIGH POTENCY PO) Take 5,000 Units by mouth daily.    . cyanocobalamin (,VITAMIN B-12,) 1000 MCG/ML injection Inject 1,000 mcg into the muscle every 14 (fourteen) days. Given in doctor's office    . diphenhydrAMINE (BENADRYL) 25 mg capsule Take 50 mg by mouth at bedtime.     Marland Kitchen estradiol (ESTRACE) 0.1 MG/GM vaginal cream Place 1 g vaginally once a week. Sundays    . Fexofenadine HCl (ALLEGRA PO) Take 1 tablet by mouth every morning. Reported on 01/16/2016    . fluticasone (KLS ALLER-FLO) 50 MCG/ACT nasal spray Place into both nostrils daily.    Marland Kitchen FOLIC ACID PO Take 1 tablet by mouth every evening. Reported on 01/16/2016    . levothyroxine (SYNTHROID, LEVOTHROID) 112 MCG tablet Take 112 mcg by mouth daily before breakfast.     . losartan (COZAAR) 100 MG tablet Take 1 tablet (100 mg total) by mouth every morning. 90 tablet 0  . Multiple Vitamins-Minerals (CENTRUM SILVER ULTRA WOMENS) TABS Take 1 tablet by mouth every morning.     . Multiple Vitamins-Minerals (PRESERVISION/LUTEIN PO) Take 1 tablet by mouth  2 (two) times daily.    . Omega-3 Fatty Acids (FISH OIL PO) Take 1 capsule by mouth 2 (two) times daily.     No current facility-administered medications for this visit.   Facility-Administered Medications Ordered in Other Visits  Medication Dose Route Frequency Provider Last Rate Last Dose  . diphenhydrAMINE (BENADRYL) injection 25 mg  25 mg Intravenous Once Jettie Booze, MD      . famotidine (PEPCID) 20 mg in sodium chloride 0.9 % 50 mL IVPB  20 mg Intravenous Once Jettie Booze, MD        REVIEW OF SYSTEMS:  [X]  denotes positive finding, [ ]  denotes negative finding Cardiac  Comments:  Chest pain or chest pressure:    Shortness of breath upon exertion:    Short of  breath when lying flat:    Irregular heart rhythm:        Vascular    Pain in calf, thigh, or hip brought on by ambulation:    Pain in feet at night that wakes you up from your sleep:     Blood clot in your veins:    Leg swelling:         Pulmonary    Oxygen at home:    Productive cough:     Wheezing:         Neurologic    Sudden weakness in arms or legs:     Sudden numbness in arms or legs:     Sudden onset of difficulty speaking or slurred speech:    Temporary loss of vision in one eye:     Problems with dizziness:         Gastrointestinal    Blood in stool:     Vomited blood:         Genitourinary    Burning when urinating:     Blood in urine:        Psychiatric    Major depression:         Hematologic    Bleeding problems:    Problems with blood clotting too easily:        Skin    Rashes or ulcers:        Constitutional    Fever or chills:      PHYSICAL EXAM: Filed Vitals:   01/30/16 0938  BP: 136/74  Pulse: 68  Height: 5\' 3"  (1.6 m)  Weight: 152 lb 11.2 oz (69.264 kg)  SpO2: 99%    GENERAL: The patient is a well-nourished female, in no acute distress. The vital signs are documented above. VASCULAR: Palpable pedal pulse on the left. Minimal bruising associated with the ablation MUSCULOSKELETAL: There are no major deformities or cyanosis. NEUROLOGIC: No focal weakness or paresthesias are detected. SKIN: There are no ulcers or rashes noted. PSYCHIATRIC: The patient has a normal affect.  DATA:  She underwent venous duplex today. This shows no evidence of DVT on the left. Small saphenous is closed with distal insertion site in her distal calf to 1.5 cm from the saphenofemoral popliteal junction  MEDICAL ISSUES: Excellent result following laser ablation 2 weeks ago. She is quite pleased with her result. Will see [place] again on an as-needed basis    Rosetta Posner, MD [facility] Tel [phone] Pager  [phone]

## 2016-02-12 ENCOUNTER — Ambulatory Visit (INDEPENDENT_AMBULATORY_CARE_PROVIDER_SITE_OTHER): Payer: Medicare Other | Admitting: *Deleted

## 2016-02-12 DIAGNOSIS — I83812 Varicose veins of left lower extremities with pain: Secondary | ICD-10-CM

## 2016-02-12 NOTE — Progress Notes (Signed)
X=.3% Sotradecol administered with a 27g butterfly.  Patient received a total of 24cc.  Treated all areas of concern. She has a combo of retics and spiders. Easy access. Tol well. More sensitive on the right than the left. One more ins covered tx next month. Follow prn.   Compression stockings applied: Yes.

## 2016-02-17 ENCOUNTER — Encounter: Payer: Self-pay | Admitting: Vascular Surgery

## 2016-03-05 ENCOUNTER — Encounter: Payer: Self-pay | Admitting: *Deleted

## 2016-03-11 ENCOUNTER — Ambulatory Visit (INDEPENDENT_AMBULATORY_CARE_PROVIDER_SITE_OTHER): Payer: Medicare Other | Admitting: *Deleted

## 2016-03-11 DIAGNOSIS — I83812 Varicose veins of left lower extremities with pain: Secondary | ICD-10-CM

## 2016-03-11 NOTE — Progress Notes (Signed)
X=.3% Sotradecol administered with a 27g butterfly.  Patient received a total of 18cc.  Treated any remaining open vessels. She is healing well. Easy access. Tol well. This is her last ins covered sclero tx. Will follow prn.    Compression stockings applied: Yes.

## 2016-03-12 ENCOUNTER — Encounter: Payer: Self-pay | Admitting: Vascular Surgery

## 2016-05-27 ENCOUNTER — Ambulatory Visit (INDEPENDENT_AMBULATORY_CARE_PROVIDER_SITE_OTHER): Payer: Medicare Other | Admitting: Ophthalmology

## 2016-08-20 ENCOUNTER — Encounter (INDEPENDENT_AMBULATORY_CARE_PROVIDER_SITE_OTHER): Payer: Medicare Other | Admitting: Ophthalmology

## 2016-08-20 DIAGNOSIS — D510 Vitamin B12 deficiency anemia due to intrinsic factor deficiency: Secondary | ICD-10-CM | POA: Diagnosis not present

## 2016-09-08 DIAGNOSIS — M1712 Unilateral primary osteoarthritis, left knee: Secondary | ICD-10-CM | POA: Diagnosis not present

## 2016-09-16 DIAGNOSIS — M1712 Unilateral primary osteoarthritis, left knee: Secondary | ICD-10-CM | POA: Diagnosis not present

## 2016-09-21 DIAGNOSIS — B349 Viral infection, unspecified: Secondary | ICD-10-CM | POA: Diagnosis not present

## 2016-09-21 DIAGNOSIS — J209 Acute bronchitis, unspecified: Secondary | ICD-10-CM | POA: Diagnosis not present

## 2016-09-23 DIAGNOSIS — M1712 Unilateral primary osteoarthritis, left knee: Secondary | ICD-10-CM | POA: Diagnosis not present

## 2016-10-07 ENCOUNTER — Ambulatory Visit (INDEPENDENT_AMBULATORY_CARE_PROVIDER_SITE_OTHER): Payer: PPO | Admitting: Ophthalmology

## 2016-10-07 DIAGNOSIS — H35033 Hypertensive retinopathy, bilateral: Secondary | ICD-10-CM | POA: Diagnosis not present

## 2016-10-07 DIAGNOSIS — H43813 Vitreous degeneration, bilateral: Secondary | ICD-10-CM

## 2016-10-07 DIAGNOSIS — H33302 Unspecified retinal break, left eye: Secondary | ICD-10-CM

## 2016-10-07 DIAGNOSIS — I10 Essential (primary) hypertension: Secondary | ICD-10-CM | POA: Diagnosis not present

## 2016-10-07 DIAGNOSIS — H353131 Nonexudative age-related macular degeneration, bilateral, early dry stage: Secondary | ICD-10-CM

## 2016-10-15 DIAGNOSIS — D510 Vitamin B12 deficiency anemia due to intrinsic factor deficiency: Secondary | ICD-10-CM | POA: Diagnosis not present

## 2016-11-06 DIAGNOSIS — M1712 Unilateral primary osteoarthritis, left knee: Secondary | ICD-10-CM | POA: Diagnosis not present

## 2016-11-11 DIAGNOSIS — Z1231 Encounter for screening mammogram for malignant neoplasm of breast: Secondary | ICD-10-CM | POA: Diagnosis not present

## 2016-11-23 ENCOUNTER — Ambulatory Visit (INDEPENDENT_AMBULATORY_CARE_PROVIDER_SITE_OTHER): Payer: PPO | Admitting: Podiatry

## 2016-11-23 VITALS — BP 143/77 | HR 79

## 2016-11-23 DIAGNOSIS — L600 Ingrowing nail: Secondary | ICD-10-CM | POA: Diagnosis not present

## 2016-11-23 DIAGNOSIS — M79675 Pain in left toe(s): Secondary | ICD-10-CM | POA: Diagnosis not present

## 2016-11-23 NOTE — Patient Instructions (Signed)

## 2016-11-23 NOTE — Progress Notes (Signed)
   Subjective:    Patient ID: Riki Altes, female    DOB: [birth year], 76 y.o.   MRN: [id number]  HPI 76 year old female presents to the office today for concerns of an ingrown toenail to the left big toe. He states that the toenail heavy removed several years ago however only grew back in a CAM and thick. She was going to seen other podiatrists once a year to have the ingrown toenail removed. She does not want have the nail removed permanently. She denies any swelling or redness or any drainage but the area is painful with pressure in shoes. She has no other complaints today.   Review of Systems  All other systems reviewed and are negative.      Objective:   Physical Exam General: AAO x3, NAD  Dermatological: There are significant dystrophy as well as discoloration thickening the left hallux toenail however there is incurvation with tenderness on the lateral aspect. There is no edema, erythema, drainage or pus or any clinical signs of infection. There is no open lesions identified.  Vascular: Dorsalis Pedis artery and Posterior Tibial artery pedal pulses are 2/4 bilateral with immedate capillary fill time.There is no pain with calf compression, swelling, warmth, erythema.   Neruologic: Grossly intact via light touch bilateral. Vibratory intact via tuning fork bilateral. Protective threshold with Semmes Wienstein monofilament intact to all pedal sites bilateral.   Musculoskeletal: tenderness to lateral aspect left hallux toenail on e ingrown toenail. No other areas of tenderness. Muscular strength 5/5 in all groups tested bilateral.  Gait: Unassisted, Nonantalgic.     Assessment & Plan:  75 year old female with left lateral hallux symptomatic ingrown toenail. -Treatment options discussed including all alternatives, risks, and complications -Etiology of symptoms were discussed -At this time, recommended partial nail removal however she would like it without chemical matricectomy. Risks  and complications were discussed with the patient for which they understand and  verbally consent to the procedure. Under sterile conditions a total of 3 mL of a mixture of 2% lidocaine plain and 0.5% Marcaine plain was infiltrated in a hallux block fashion. Once anesthetized, the skin was prepped in sterile fashion. A tourniquet was then applied. Next the lateral border of the hallux nail border was sharply excised making sure to remove the entire offending nail border. Once the nail was removed, the area was debrided and the underlying skin was intact. The area was irrigated and hemostasis was obtained.  A dry sterile dressing was applied. After application of the dressing the tourniquet was removed and there is found to be an immediate capillary refill time to the digit. The patient tolerated the procedure well any complications. Post procedure instructions were discussed the patient for which he verbally understood. Follow-up in one week for nail check or sooner if any problems are to arise. Discussed signs/symptoms of worsening infection and directed to call the office immediately should any occur or go directly to the emergency room. In the meantime, encouraged to call the office with any questions, concerns, changes symptoms.  Celesta Gentile, DPM

## 2016-12-02 DIAGNOSIS — D510 Vitamin B12 deficiency anemia due to intrinsic factor deficiency: Secondary | ICD-10-CM | POA: Diagnosis not present

## 2016-12-03 ENCOUNTER — Telehealth: Payer: Self-pay | Admitting: *Deleted

## 2016-12-03 NOTE — Telephone Encounter (Signed)
Pt states she had an ingrown toenail procedure and wanted to know how long she needed to soak. I informed her she needed to soak 4-6 weeks until she had a dry hard scab without redness, swelling, drainage.

## 2017-01-01 DIAGNOSIS — D510 Vitamin B12 deficiency anemia due to intrinsic factor deficiency: Secondary | ICD-10-CM | POA: Diagnosis not present

## 2017-01-29 DIAGNOSIS — D510 Vitamin B12 deficiency anemia due to intrinsic factor deficiency: Secondary | ICD-10-CM | POA: Diagnosis not present

## 2017-03-03 DIAGNOSIS — D510 Vitamin B12 deficiency anemia due to intrinsic factor deficiency: Secondary | ICD-10-CM | POA: Diagnosis not present

## 2017-03-11 DIAGNOSIS — F439 Reaction to severe stress, unspecified: Secondary | ICD-10-CM | POA: Diagnosis not present

## 2017-03-11 DIAGNOSIS — D510 Vitamin B12 deficiency anemia due to intrinsic factor deficiency: Secondary | ICD-10-CM | POA: Diagnosis not present

## 2017-03-11 DIAGNOSIS — L659 Nonscarring hair loss, unspecified: Secondary | ICD-10-CM | POA: Diagnosis not present

## 2017-03-11 DIAGNOSIS — R252 Cramp and spasm: Secondary | ICD-10-CM | POA: Diagnosis not present

## 2017-03-11 DIAGNOSIS — R0981 Nasal congestion: Secondary | ICD-10-CM | POA: Diagnosis not present

## 2017-03-25 DIAGNOSIS — D510 Vitamin B12 deficiency anemia due to intrinsic factor deficiency: Secondary | ICD-10-CM | POA: Diagnosis not present

## 2017-04-01 DIAGNOSIS — R351 Nocturia: Secondary | ICD-10-CM | POA: Diagnosis not present

## 2017-04-01 DIAGNOSIS — N3020 Other chronic cystitis without hematuria: Secondary | ICD-10-CM | POA: Diagnosis not present

## 2017-04-20 DIAGNOSIS — M8589 Other specified disorders of bone density and structure, multiple sites: Secondary | ICD-10-CM | POA: Diagnosis not present

## 2017-04-22 DIAGNOSIS — D510 Vitamin B12 deficiency anemia due to intrinsic factor deficiency: Secondary | ICD-10-CM | POA: Diagnosis not present

## 2017-04-22 DIAGNOSIS — J301 Allergic rhinitis due to pollen: Secondary | ICD-10-CM | POA: Diagnosis not present

## 2017-04-28 DIAGNOSIS — E039 Hypothyroidism, unspecified: Secondary | ICD-10-CM | POA: Diagnosis not present

## 2017-04-28 DIAGNOSIS — R0982 Postnasal drip: Secondary | ICD-10-CM | POA: Diagnosis not present

## 2017-04-28 DIAGNOSIS — L659 Nonscarring hair loss, unspecified: Secondary | ICD-10-CM | POA: Diagnosis not present

## 2017-04-28 DIAGNOSIS — I10 Essential (primary) hypertension: Secondary | ICD-10-CM | POA: Diagnosis not present

## 2017-05-28 DIAGNOSIS — Z23 Encounter for immunization: Secondary | ICD-10-CM | POA: Diagnosis not present

## 2017-05-28 DIAGNOSIS — D510 Vitamin B12 deficiency anemia due to intrinsic factor deficiency: Secondary | ICD-10-CM | POA: Diagnosis not present

## 2017-06-03 DIAGNOSIS — L918 Other hypertrophic disorders of the skin: Secondary | ICD-10-CM | POA: Diagnosis not present

## 2017-06-03 DIAGNOSIS — Z808 Family history of malignant neoplasm of other organs or systems: Secondary | ICD-10-CM | POA: Diagnosis not present

## 2017-06-03 DIAGNOSIS — D225 Melanocytic nevi of trunk: Secondary | ICD-10-CM | POA: Diagnosis not present

## 2017-06-03 DIAGNOSIS — L821 Other seborrheic keratosis: Secondary | ICD-10-CM | POA: Diagnosis not present

## 2017-06-03 DIAGNOSIS — Z85828 Personal history of other malignant neoplasm of skin: Secondary | ICD-10-CM | POA: Diagnosis not present

## 2017-06-07 DIAGNOSIS — J329 Chronic sinusitis, unspecified: Secondary | ICD-10-CM | POA: Diagnosis not present

## 2017-06-07 DIAGNOSIS — R252 Cramp and spasm: Secondary | ICD-10-CM | POA: Diagnosis not present

## 2017-06-07 DIAGNOSIS — M25569 Pain in unspecified knee: Secondary | ICD-10-CM | POA: Diagnosis not present

## 2017-06-18 DIAGNOSIS — M1711 Unilateral primary osteoarthritis, right knee: Secondary | ICD-10-CM | POA: Diagnosis not present

## 2017-06-18 DIAGNOSIS — M25551 Pain in right hip: Secondary | ICD-10-CM | POA: Diagnosis not present

## 2017-06-28 DIAGNOSIS — M1711 Unilateral primary osteoarthritis, right knee: Secondary | ICD-10-CM | POA: Diagnosis not present

## 2017-06-28 DIAGNOSIS — I10 Essential (primary) hypertension: Secondary | ICD-10-CM | POA: Diagnosis not present

## 2017-06-28 DIAGNOSIS — D510 Vitamin B12 deficiency anemia due to intrinsic factor deficiency: Secondary | ICD-10-CM | POA: Diagnosis not present

## 2017-06-28 DIAGNOSIS — E039 Hypothyroidism, unspecified: Secondary | ICD-10-CM | POA: Diagnosis not present

## 2017-06-28 DIAGNOSIS — L659 Nonscarring hair loss, unspecified: Secondary | ICD-10-CM | POA: Diagnosis not present

## 2017-06-30 DIAGNOSIS — G4700 Insomnia, unspecified: Secondary | ICD-10-CM | POA: Diagnosis not present

## 2017-06-30 DIAGNOSIS — E559 Vitamin D deficiency, unspecified: Secondary | ICD-10-CM | POA: Diagnosis not present

## 2017-06-30 DIAGNOSIS — J329 Chronic sinusitis, unspecified: Secondary | ICD-10-CM | POA: Diagnosis not present

## 2017-06-30 DIAGNOSIS — R252 Cramp and spasm: Secondary | ICD-10-CM | POA: Diagnosis not present

## 2017-06-30 DIAGNOSIS — M8580 Other specified disorders of bone density and structure, unspecified site: Secondary | ICD-10-CM | POA: Diagnosis not present

## 2017-06-30 DIAGNOSIS — I10 Essential (primary) hypertension: Secondary | ICD-10-CM | POA: Diagnosis not present

## 2017-06-30 DIAGNOSIS — Z0000 Encounter for general adult medical examination without abnormal findings: Secondary | ICD-10-CM | POA: Diagnosis not present

## 2017-06-30 DIAGNOSIS — D510 Vitamin B12 deficiency anemia due to intrinsic factor deficiency: Secondary | ICD-10-CM | POA: Diagnosis not present

## 2017-06-30 DIAGNOSIS — E039 Hypothyroidism, unspecified: Secondary | ICD-10-CM | POA: Diagnosis not present

## 2017-07-05 DIAGNOSIS — S83241D Other tear of medial meniscus, current injury, right knee, subsequent encounter: Secondary | ICD-10-CM | POA: Diagnosis not present

## 2017-07-07 ENCOUNTER — Other Ambulatory Visit: Payer: Self-pay | Admitting: Family Medicine

## 2017-07-07 ENCOUNTER — Ambulatory Visit
Admission: RE | Admit: 2017-07-07 | Discharge: 2017-07-07 | Disposition: A | Discharge status: Home / Self Care | Payer: PPO | Source: Ambulatory Visit | Attending: Family Medicine | Admitting: Family Medicine

## 2017-07-07 DIAGNOSIS — R0789 Other chest pain: Secondary | ICD-10-CM

## 2017-07-12 DIAGNOSIS — J310 Chronic rhinitis: Secondary | ICD-10-CM | POA: Insufficient documentation

## 2017-07-12 DIAGNOSIS — J019 Acute sinusitis, unspecified: Secondary | ICD-10-CM | POA: Diagnosis not present

## 2017-07-12 DIAGNOSIS — K219 Gastro-esophageal reflux disease without esophagitis: Secondary | ICD-10-CM | POA: Diagnosis not present

## 2017-07-13 DIAGNOSIS — S83241A Other tear of medial meniscus, current injury, right knee, initial encounter: Secondary | ICD-10-CM | POA: Diagnosis not present

## 2017-07-13 DIAGNOSIS — M25561 Pain in right knee: Secondary | ICD-10-CM | POA: Diagnosis not present

## 2017-07-20 DIAGNOSIS — R1310 Dysphagia, unspecified: Secondary | ICD-10-CM | POA: Diagnosis not present

## 2017-07-20 DIAGNOSIS — G479 Sleep disorder, unspecified: Secondary | ICD-10-CM | POA: Diagnosis not present

## 2017-08-03 DIAGNOSIS — D510 Vitamin B12 deficiency anemia due to intrinsic factor deficiency: Secondary | ICD-10-CM | POA: Diagnosis not present

## 2017-08-06 DIAGNOSIS — X58XXXA Exposure to other specified factors, initial encounter: Secondary | ICD-10-CM | POA: Diagnosis not present

## 2017-08-06 DIAGNOSIS — M94261 Chondromalacia, right knee: Secondary | ICD-10-CM | POA: Diagnosis not present

## 2017-08-06 DIAGNOSIS — S83231A Complex tear of medial meniscus, current injury, right knee, initial encounter: Secondary | ICD-10-CM | POA: Diagnosis not present

## 2017-08-06 DIAGNOSIS — Y929 Unspecified place or not applicable: Secondary | ICD-10-CM | POA: Diagnosis not present

## 2017-08-06 DIAGNOSIS — G8918 Other acute postprocedural pain: Secondary | ICD-10-CM | POA: Diagnosis not present

## 2017-08-06 DIAGNOSIS — Y939 Activity, unspecified: Secondary | ICD-10-CM | POA: Diagnosis not present

## 2017-08-06 DIAGNOSIS — M23321 Other meniscus derangements, posterior horn of medial meniscus, right knee: Secondary | ICD-10-CM | POA: Diagnosis not present

## 2017-08-06 DIAGNOSIS — M23361 Other meniscus derangements, other lateral meniscus, right knee: Secondary | ICD-10-CM | POA: Diagnosis not present

## 2017-08-06 DIAGNOSIS — Y999 Unspecified external cause status: Secondary | ICD-10-CM | POA: Diagnosis not present

## 2017-08-19 DIAGNOSIS — M23321 Other meniscus derangements, posterior horn of medial meniscus, right knee: Secondary | ICD-10-CM | POA: Diagnosis not present

## 2017-08-19 DIAGNOSIS — M25561 Pain in right knee: Secondary | ICD-10-CM | POA: Diagnosis not present

## 2017-08-23 DIAGNOSIS — M25561 Pain in right knee: Secondary | ICD-10-CM | POA: Diagnosis not present

## 2017-08-23 DIAGNOSIS — M23321 Other meniscus derangements, posterior horn of medial meniscus, right knee: Secondary | ICD-10-CM | POA: Diagnosis not present

## 2017-08-25 DIAGNOSIS — M23321 Other meniscus derangements, posterior horn of medial meniscus, right knee: Secondary | ICD-10-CM | POA: Diagnosis not present

## 2017-08-25 DIAGNOSIS — M25561 Pain in right knee: Secondary | ICD-10-CM | POA: Diagnosis not present

## 2017-09-01 DIAGNOSIS — R413 Other amnesia: Secondary | ICD-10-CM | POA: Diagnosis not present

## 2017-09-01 DIAGNOSIS — M23321 Other meniscus derangements, posterior horn of medial meniscus, right knee: Secondary | ICD-10-CM | POA: Diagnosis not present

## 2017-09-01 DIAGNOSIS — R42 Dizziness and giddiness: Secondary | ICD-10-CM | POA: Diagnosis not present

## 2017-09-01 DIAGNOSIS — M25561 Pain in right knee: Secondary | ICD-10-CM | POA: Diagnosis not present

## 2017-09-01 DIAGNOSIS — R1013 Epigastric pain: Secondary | ICD-10-CM | POA: Diagnosis not present

## 2017-09-01 DIAGNOSIS — I10 Essential (primary) hypertension: Secondary | ICD-10-CM | POA: Diagnosis not present

## 2017-09-01 DIAGNOSIS — I701 Atherosclerosis of renal artery: Secondary | ICD-10-CM | POA: Diagnosis not present

## 2017-09-02 DIAGNOSIS — D510 Vitamin B12 deficiency anemia due to intrinsic factor deficiency: Secondary | ICD-10-CM | POA: Diagnosis not present

## 2017-09-02 DIAGNOSIS — M25561 Pain in right knee: Secondary | ICD-10-CM | POA: Diagnosis not present

## 2017-09-02 DIAGNOSIS — M23321 Other meniscus derangements, posterior horn of medial meniscus, right knee: Secondary | ICD-10-CM | POA: Diagnosis not present

## 2017-09-06 DIAGNOSIS — M23321 Other meniscus derangements, posterior horn of medial meniscus, right knee: Secondary | ICD-10-CM | POA: Diagnosis not present

## 2017-09-06 DIAGNOSIS — M25561 Pain in right knee: Secondary | ICD-10-CM | POA: Diagnosis not present

## 2017-09-08 DIAGNOSIS — M23321 Other meniscus derangements, posterior horn of medial meniscus, right knee: Secondary | ICD-10-CM | POA: Diagnosis not present

## 2017-09-08 DIAGNOSIS — M25561 Pain in right knee: Secondary | ICD-10-CM | POA: Diagnosis not present

## 2017-09-09 DIAGNOSIS — R413 Other amnesia: Secondary | ICD-10-CM | POA: Diagnosis not present

## 2017-09-09 DIAGNOSIS — G4700 Insomnia, unspecified: Secondary | ICD-10-CM | POA: Diagnosis not present

## 2017-09-09 DIAGNOSIS — H9319 Tinnitus, unspecified ear: Secondary | ICD-10-CM | POA: Diagnosis not present

## 2017-09-09 DIAGNOSIS — I10 Essential (primary) hypertension: Secondary | ICD-10-CM | POA: Diagnosis not present

## 2017-09-09 DIAGNOSIS — I701 Atherosclerosis of renal artery: Secondary | ICD-10-CM | POA: Diagnosis not present

## 2017-09-09 DIAGNOSIS — Z9889 Other specified postprocedural states: Secondary | ICD-10-CM | POA: Diagnosis not present

## 2017-09-13 DIAGNOSIS — M23321 Other meniscus derangements, posterior horn of medial meniscus, right knee: Secondary | ICD-10-CM | POA: Diagnosis not present

## 2017-09-13 DIAGNOSIS — M25561 Pain in right knee: Secondary | ICD-10-CM | POA: Diagnosis not present

## 2017-09-15 ENCOUNTER — Telehealth: Payer: Self-pay | Admitting: Interventional Cardiology

## 2017-09-15 NOTE — Telephone Encounter (Signed)
Called patient back. She came in office signed release asking for testing done " Don't know where performed/date"  See release scanned in. After speaking to patient she was asking for Vascular Study performed by Dr. Curt Jews I made Rose Moody aware I cannot release this information for one she did not have this test performed here at our office. Patient became a little upset with me and started yelling on the phone " you can release the information its my information" just never mind I see Dr.Varanasi on March 5th he will release it to me then. Phone call was ended because patient hung up.

## 2017-09-17 DIAGNOSIS — M23321 Other meniscus derangements, posterior horn of medial meniscus, right knee: Secondary | ICD-10-CM | POA: Diagnosis not present

## 2017-09-17 DIAGNOSIS — M25561 Pain in right knee: Secondary | ICD-10-CM | POA: Diagnosis not present

## 2017-09-22 DIAGNOSIS — M25561 Pain in right knee: Secondary | ICD-10-CM | POA: Diagnosis not present

## 2017-09-22 DIAGNOSIS — M23321 Other meniscus derangements, posterior horn of medial meniscus, right knee: Secondary | ICD-10-CM | POA: Diagnosis not present

## 2017-09-23 DIAGNOSIS — M23321 Other meniscus derangements, posterior horn of medial meniscus, right knee: Secondary | ICD-10-CM | POA: Diagnosis not present

## 2017-09-23 DIAGNOSIS — M25561 Pain in right knee: Secondary | ICD-10-CM | POA: Diagnosis not present

## 2017-09-27 DIAGNOSIS — M23321 Other meniscus derangements, posterior horn of medial meniscus, right knee: Secondary | ICD-10-CM | POA: Diagnosis not present

## 2017-09-27 DIAGNOSIS — M25561 Pain in right knee: Secondary | ICD-10-CM | POA: Diagnosis not present

## 2017-09-28 DIAGNOSIS — M25561 Pain in right knee: Secondary | ICD-10-CM | POA: Diagnosis not present

## 2017-09-28 DIAGNOSIS — M1711 Unilateral primary osteoarthritis, right knee: Secondary | ICD-10-CM | POA: Diagnosis not present

## 2017-10-05 DIAGNOSIS — D510 Vitamin B12 deficiency anemia due to intrinsic factor deficiency: Secondary | ICD-10-CM | POA: Diagnosis not present

## 2017-10-08 DIAGNOSIS — R05 Cough: Secondary | ICD-10-CM | POA: Diagnosis not present

## 2017-10-14 DIAGNOSIS — R05 Cough: Secondary | ICD-10-CM | POA: Diagnosis not present

## 2017-10-19 ENCOUNTER — Ambulatory Visit: Payer: PPO | Admitting: Interventional Cardiology

## 2017-10-19 ENCOUNTER — Encounter: Payer: Self-pay | Admitting: Interventional Cardiology

## 2017-10-19 VITALS — BP 122/70 | HR 67 | Ht 63.0 in | Wt 140.6 lb

## 2017-10-19 DIAGNOSIS — I10 Essential (primary) hypertension: Secondary | ICD-10-CM | POA: Diagnosis not present

## 2017-10-19 MED ORDER — CANDESARTAN CILEXETIL 32 MG PO TABS: 32.0000 mg | ORAL_TABLET | Freq: Every day | ORAL | 3 refills | Status: DC

## 2017-10-19 NOTE — Progress Notes (Signed)
Cardiology Office Note   Date:  10/19/2017   ID:  Rose Moody, DOB [birth year], MRN [id number]  PCP:  Rose Cruel, MD    No chief complaint on file. HTN   Wt Readings from Last 3 Encounters:  10/19/17 140 lb 9.6 oz (63.8 kg)  01/30/16 152 lb 11.2 oz (69.3 kg)  01/16/16 149 lb (67.6 kg)       History of Present Illness: Rose Moody is a 77 y.o. female  With HTN.  Was thought to have renal artery stenosis by duplex, but then angiogram was performed.  There is no evidence of renal artery stenosis.  She did have dual arterial supply to both kidneys.  Retired from [employer].   I have not seen her since 2015.  Today, she reports that her BP has been controlled, and even low at times.  She reports that she was on several meds that were recalled due to cancer risks.    She was switched to irbesartan.  She does not want to be on generic medicines from [place] and [place].   She had an episode of chest pain after having a reaction to trazodone.    Since then, no problems.   Denies : Chest pain. Dizziness. Leg edema. Nitroglycerin use. Orthopnea. Palpitations. Paroxysmal nocturnal dyspnea. Shortness of breath. Syncope.     Past Medical History:  Diagnosis Date  . Anemia   . Detached retina   . Hypertension   . Thyroid disease   . UTI (lower urinary tract infection)   . Varicose veins     Past Surgical History:  Procedure Laterality Date  . ABDOMINAL HYSTERECTOMY  age 73  . APPENDECTOMY  age 64  . BLADDER SUSPENSION  age 21  . ENDOVENOUS ABLATION SAPHENOUS VEIN W/ LASER Left 01-16-2016   endovenous laser ablation left small saphenous vein by Rose Jews MD  . laser surgery for retinal detachment Left 2016   Dr. Zigmund Moody  . RENAL ANGIOGRAM N/A 01/28/2012   Procedure: RENAL ANGIOGRAM;  Surgeon: Rose Booze, MD;  Location: [facility] CATH LAB;  Service: Cardiovascular;  Laterality: N/A;  . stab phlebectomy Right 12-08-2012   > 20 incisions by Rose Jews MD      Current Outpatient Medications  Medication Sig Dispense Refill  . Calcium Carbonate-Vitamin D (CALCIUM + D PO) Take 1 tablet by mouth 2 (two) times daily. Reported on 01/16/2016    . cephALEXin (KEFLEX) 250 MG capsule Take by mouth daily.    . Cholecalciferol (VITAMIN D HIGH POTENCY PO) Take 5,000 Units by mouth daily.    . cyanocobalamin (,VITAMIN B-12,) 1000 MCG/ML injection Inject 1,000 mcg into the muscle every 14 (fourteen) days. Given in doctor's office    . diphenhydrAMINE (BENADRYL) 25 mg capsule Take 50 mg by mouth at bedtime.     Marland Kitchen estradiol (ESTRACE) 0.1 MG/GM vaginal cream Place 1 g vaginally once a week. Sundays    . Fexofenadine HCl (ALLEGRA PO) Take 1 tablet by mouth every morning. Reported on 01/16/2016    . ipratropium (ATROVENT) 0.03 % nasal spray Place 2-3 sprays into both nostrils daily.    . irbesartan (AVAPRO) 300 MG tablet Take 1 tablet by mouth daily.    Marland Kitchen levothyroxine (SYNTHROID, LEVOTHROID) 112 MCG tablet Take 112 mcg by mouth daily before breakfast.     . magnesium oxide (MAG-OX) 400 MG tablet Take 1-2 tablets by mouth daily.    . Multiple Vitamins-Minerals (CENTRUM SILVER ULTRA WOMENS) TABS Take 1 tablet  by mouth every morning.     . Multiple Vitamins-Minerals (PRESERVISION/LUTEIN PO) Take 1 tablet by mouth 2 (two) times daily.    . predniSONE (DELTASONE) 10 MG tablet Taper  0  . promethazine-dextromethorphan (PROMETHAZINE-DM) 6.25-15 MG/5ML syrup Taper  0   No current facility-administered medications for this visit.    Facility-Administered Medications Ordered in Other Visits  Medication Dose Route Frequency Provider Last Rate Last Dose  . diphenhydrAMINE (BENADRYL) injection 25 mg  25 mg Intravenous Once Rose Booze, MD      . famotidine (PEPCID) 20 mg in sodium chloride 0.9 % 50 mL IVPB  20 mg Intravenous Once Rose Booze, MD        Allergies:   Trazodone and nefazodone; Codeine; Contrast media [iodinated diagnostic agents]; Cortisone;  Dyclonine; Fesoterodine; Lisinopril; Tequila sunrise flavor; Toviaz [fesoterodine fumarate]; Astelin; Azelastine; Azithromycin; Cefuroxime axetil; Ciprofloxacin; Estradiol; Metrizamide; Ondansetron; Zithromax [azithromycin dihydrate]; and Zofran    Social History:  The patient  reports that  has never smoked. she has never used smokeless tobacco. She reports that she does not drink alcohol or use drugs.   Family History:  The patient's family history includes Cancer in her mother; Heart disease in her father and sister; Stroke in her mother.    ROS:  Please see the history of present illness.   Otherwise, review of systems are positive for anxiety regarding he rmeds.   All other systems are reviewed and negative.    PHYSICAL EXAM: VS:  BP 122/70 (BP Location: Left Arm, Patient Position: Sitting, Cuff Size: Normal)   Pulse 67   Ht 5\' 3"  (1.6 m)   Wt 140 lb 9.6 oz (63.8 kg)   SpO2 98%   BMI 24.91 kg/m  , BMI Body mass index is 24.91 kg/m. GEN: Well nourished, well developed, in no acute distress  HEENT: normal  Neck: no JVD, carotid bruits, or masses Cardiac: RRR; no murmurs, rubs, or gallops,no edema  Respiratory:  clear to auscultation bilaterally, normal work of breathing GI: soft, nontender, nondistended, + BS MS: no deformity or atrophy  Skin: warm and dry, no rash Neuro:  Strength and sensation are intact Psych: euthymic mood, full affect   EKG:   The ekg ordered in 11/18 demonstrates NSR, no ST changes   Recent Labs: No results found for requested labs within last 8760 hours.   Lipid Panel No results found for: CHOL, TRIG, HDL, CHOLHDL, VLDL, LDLCALC, LDLDIRECT   Other studies Reviewed: Additional studies/ records that were reviewed today with results demonstrating: prior renal angio reviewed.   ASSESSMENT AND PLAN:  1. HTN: Per her request, will change ARB from Irbesartan to Candesartan 32 mg daily.  THis is apparently made in the [place] and [place]. 2. She did  carries a diagnosis of renal artery atherosclerosis in the past.  Of note, she had a renal angiogram several years ago showing no atherosclerosis but instead multiple smaller artery supplying each kidney.  This created an artifact which mimicked a stenosis.  Her blood pressure is well controlled on just one blood pressure lowering medicine.  I do not think she has renal artery stenosis. 3. Continue regular exercise.  She will try to find something that is easier on her knees.  If she has problems with exercise, would consider further cardiac workup.  She will call us if her blood pressures are not well controlled.  We will also check electrolytes in 1-2 weeks.   Current medicines are reviewed at length with the  patient today.  The patient concerns regarding her medicines were addressed.  The following changes have been made:    Labs/ tests ordered today include:  No orders of the defined types were placed in this encounter.   Recommend 150 minutes/week of aerobic exercise Low fat, low carb, high fiber diet recommended  Disposition:   FU as needed   Signed, Larae Grooms, MD  10/19/2017 2:37 PM    [address], [place], [place]  [address] Phone: [phone]; Fax: [phone]

## 2017-10-19 NOTE — Patient Instructions (Signed)
Medication Instructions:  Your physician has recommended you make the following change in your medication:   1. STOP: irbesartan  2. START: candesartan 32 mg daily  Labwork: Your physician recommends that you return for lab work in: 1 week for BMET  Testing/Procedures: None ordered  Follow-Up: Your physician wants you to follow-up with Dr. Irish Lack AS NEEDED  Any Other Special Instructions Will Be Listed Below (If Applicable).     If you need a refill on your cardiac medications before your next appointment, please call your pharmacy.

## 2017-10-26 DIAGNOSIS — M25561 Pain in right knee: Secondary | ICD-10-CM | POA: Diagnosis not present

## 2017-10-27 ENCOUNTER — Other Ambulatory Visit: Payer: PPO

## 2017-10-27 DIAGNOSIS — I10 Essential (primary) hypertension: Secondary | ICD-10-CM | POA: Diagnosis not present

## 2017-10-28 LAB — BASIC METABOLIC PANEL
BUN/Creatinine Ratio: 23 (ref 12–28)
BUN: 23 mg/dL (ref 8–27)
CO2: 25 mmol/L (ref 20–29)
Calcium: 9.1 mg/dL (ref 8.7–10.3)
Chloride: 103 mmol/L (ref 96–106)
Creatinine, Ser: 1.02 mg/dL — ABNORMAL HIGH (ref 0.57–1.00)
GFR calc Af Amer: 61 mL/min/{1.73_m2} (ref >59)
GFR calc non Af Amer: 53 mL/min/{1.73_m2} — ABNORMAL LOW (ref >59)
Glucose: 82 mg/dL (ref 65–99)
Potassium: 4.2 mmol/L (ref 3.5–5.2)
Sodium: 142 mmol/L (ref 134–144)

## 2017-11-02 DIAGNOSIS — D510 Vitamin B12 deficiency anemia due to intrinsic factor deficiency: Secondary | ICD-10-CM | POA: Diagnosis not present

## 2017-11-03 ENCOUNTER — Encounter (INDEPENDENT_AMBULATORY_CARE_PROVIDER_SITE_OTHER): Payer: PPO | Admitting: Ophthalmology

## 2017-11-03 DIAGNOSIS — H43813 Vitreous degeneration, bilateral: Secondary | ICD-10-CM | POA: Diagnosis not present

## 2017-11-03 DIAGNOSIS — I10 Essential (primary) hypertension: Secondary | ICD-10-CM | POA: Diagnosis not present

## 2017-11-03 DIAGNOSIS — H33302 Unspecified retinal break, left eye: Secondary | ICD-10-CM | POA: Diagnosis not present

## 2017-11-03 DIAGNOSIS — H35033 Hypertensive retinopathy, bilateral: Secondary | ICD-10-CM

## 2017-11-03 DIAGNOSIS — H353132 Nonexudative age-related macular degeneration, bilateral, intermediate dry stage: Secondary | ICD-10-CM

## 2017-11-10 ENCOUNTER — Telehealth: Payer: Self-pay | Admitting: Interventional Cardiology

## 2017-11-10 DIAGNOSIS — I10 Essential (primary) hypertension: Secondary | ICD-10-CM

## 2017-11-10 MED ORDER — LISINOPRIL 40 MG PO TABS: 40.0000 mg | ORAL_TABLET | Freq: Every day | ORAL | 11 refills | Status: DC

## 2017-11-10 NOTE — Telephone Encounter (Signed)
New message   Pt c/o BP issue: STAT if pt c/o blurred vision, one-sided weakness or slurred speech  1. What are your last 5 BP readings? 158/100, 157/98, 150/111  2. Are you having any other symptoms (ex. Dizziness, headache, blurred vision, passed out)? NO  3. What is your BP issue? Concerned that medication is not controlling   Pt c/o medication issue:  1. Name of Medication: candesartan (ATACAND) 32 MG tablet  2. How are you currently taking this medication (dosage and times per day)? Take 1 tablet (32 mg total) by mouth daily.  3. Are you having a reaction (difficulty breathing--STAT)? NO  4. What is your medication issue? BP uncontrolled

## 2017-11-10 NOTE — Telephone Encounter (Signed)
Returned call to patient. Patient was switched to candesartan 32 mg QD on 3/5 from irbesartan 300 mg QD. Patient states that the candesartan  is not controlling her BP. She states that it has been running 158/100, 157/98, 150/111. Patient denies having any dizziness, HA, speech or vision changes. She states however her hair loss has worsened and that she has had to vacuum it up off of the floor. She states hat her appetite has also increased. Patient is requesting something else to control her BP. Patient is refusing to take: candesartan, irbesartan, losartan, or amlodipine. Patient has lisinopril listed as an allergy with cough as the reaction. Patient states that she is willing to try the lisinopril again. Discussed with JV. Rx for lisinopril 40 mg QD sent to patient's preferred pharmacy. Patient will return in 1 week for BMET on 11/18/17. Instructed patient to continue to monitor BP. Patient verbalizes understanding and is in agreement with this plan.

## 2017-11-15 DIAGNOSIS — Z1231 Encounter for screening mammogram for malignant neoplasm of breast: Secondary | ICD-10-CM | POA: Diagnosis not present

## 2017-11-18 ENCOUNTER — Other Ambulatory Visit: Payer: PPO

## 2017-11-18 ENCOUNTER — Telehealth: Payer: Self-pay | Admitting: Interventional Cardiology

## 2017-11-18 NOTE — Telephone Encounter (Signed)
Since she has a cough on lisinopril would anticipate cough on other ACEi. ARBs are also not likely an option. If she would be willing to try another calcium channel blocker could use nifedipine. Other classes would be diuretic (would recommend chlorthalidone) or beta blocker (would recommend carvedilol). If needed she could be scheduled in HTN clinic.

## 2017-11-18 NOTE — Telephone Encounter (Signed)
Patient complaining of lisinopril causing cough and does not want to take it. Patient also refusing to take candesartan, irbesartan, losartan, or amlodipine (see phone note 3/27. What other options for blood pressure control does she have? Thanks

## 2017-11-18 NOTE — Telephone Encounter (Signed)
New message    Pt c/o medication issue:  1. Name of Medication: lisinopril (PRINIVIL,ZESTRIL) 40 MG tablet  2. How are you currently taking this medication (dosage and times per day)? Take 1 tablet (40 mg total) by mouth daily.  3. Are you having a reaction (difficulty breathing--STAT)? Cough  4. What is your medication issue? Patient states medication causing her to cough.  BP today 140/86, 127/66

## 2017-11-19 NOTE — Telephone Encounter (Signed)
Called and spoke to patient who agrees to come in to the HTN Clinic to discuss BP management given her inability to take several meds. Appointment scheduled for 11/22/17 at 4:00 PM.

## 2017-11-22 ENCOUNTER — Ambulatory Visit (INDEPENDENT_AMBULATORY_CARE_PROVIDER_SITE_OTHER): Payer: PPO | Admitting: Pharmacist

## 2017-11-22 VITALS — BP 136/64 | HR 70 | Ht 62.0 in | Wt 145.0 lb

## 2017-11-22 DIAGNOSIS — I10 Essential (primary) hypertension: Secondary | ICD-10-CM | POA: Diagnosis not present

## 2017-11-22 NOTE — Patient Instructions (Addendum)
Return for a  follow up appointment in 3 weeks  Check your blood pressure at home daily (if able) and keep record of the readings.  Take your BP meds as follows: *CONTINUE all medication as prescribed*  *CALL Madyx Delfin (pharmacist) back on Wednesday 11/24/17 at [phone]*    Doxazosin (Cardura) 2mg  every evening   No problems with kidneys   < 1% probability of hair loss   No weight gain   Minoxidil 2.5mg  twice daily   No problems with kidneys   Hair gain/health   Possible weight gain   Nifedipine 30mg  daily   No problems with kidneys   < 1% hair probability of hair loss   No weight gain exopected    Bring your BP cuff and your record of home blood pressures to your next appointment.  Exercise as you're able, try to walk approximately 30 minutes per day.  Keep salt intake to a minimum, especially watch canned and prepared boxed foods.  Eat more fresh fruits and vegetables and fewer canned items.  Avoid eating in fast food restaurants.    HOW TO TAKE YOUR BLOOD PRESSURE: . Rest 5 minutes before taking your blood pressure. .  Don't smoke or drink caffeinated beverages for at least 30 minutes before. . Take your blood pressure before (not after) you eat. . Sit comfortably with your back supported and both feet on the floor (don't cross your legs). . Elevate your arm to heart level on a table or a desk. . Use the proper sized cuff. It should fit smoothly and snugly around your bare upper arm. There should be enough room to slip a fingertip under the cuff. The bottom edge of the cuff should be 1 inch above the crease of the elbow. . Ideally, take 3 measurements at one sitting and record the average.

## 2017-11-22 NOTE — Progress Notes (Signed)
Patient ID: Rose Moody                 DOB: [birth year]                      MRN: [id number]     HPI: Rose Moody is a 77 y.o. female referred by Dr. Irish Lack to HTN clinic. PMH includes hypertension, thyroid diease, and compliance issues. Also noted multiple allergies and intolerance to medication. She will not take any product produced in [place] or [place]. Will prefer medication without potential for weight gain or hair loss. Patient also likes to research all medication on the Internet to verify potential side effects of recommended therapy.  She present today to HTN clinic to discuss options to manage her BP once lisinopril is discontinued.   Current HTN meds:  Lisinopril - still taking but causing cough  Intolerance:  Diovan - due to recall Irbesartan - lack efficacy Candesartan - lack efficacy Amlodipine - severe hair loss   BP goal: 130/80  Family History: family history includes Cancer in her mother; Heart disease in her father and sister; Stroke in her mother.   Social History: reports that  has never smoked. she has never used smokeless tobacco. She reports that she does not drink alcohol or use drugs.   Diet: mainly at home; working on low sodium diet  Exercise: not very active at the moment, just recovered from knee surgery ; plan to start going to [facility] soon.  Home BP readings: No records available; 134/74 pulse 78  Wt Readings from Last 3 Encounters:  11/22/17 145 lb (65.8 kg)  10/19/17 140 lb 9.6 oz (63.8 kg)  01/30/16 152 lb 11.2 oz (69.3 kg)   BP Readings from Last 3 Encounters:  11/22/17 136/64  10/19/17 122/70  11/23/16 (!) 143/77   Pulse Readings from Last 3 Encounters:  11/22/17 70  10/19/17 67  11/23/16 79    Past Medical History:  Diagnosis Date  . Anemia   . Detached retina   . Hypertension   . Thyroid disease   . UTI (lower urinary tract infection)   . Varicose veins     Current Outpatient Medications on File Prior to Visit  Medication Sig  Dispense Refill  . Calcium Carbonate-Vitamin D (CALCIUM + D PO) Take 1 tablet by mouth 2 (two) times daily. Reported on 01/16/2016    . cephALEXin (KEFLEX) 250 MG capsule Take by mouth daily.    . Cholecalciferol (VITAMIN D HIGH POTENCY PO) Take 5,000 Units by mouth daily.    . cyanocobalamin (,VITAMIN B-12,) 1000 MCG/ML injection Inject 1,000 mcg into the muscle every 14 (fourteen) days. Given in doctor's office    . diphenhydrAMINE (BENADRYL) 25 mg capsule Take 50 mg by mouth at bedtime.     Marland Kitchen estradiol (ESTRACE) 0.1 MG/GM vaginal cream Place 1 g vaginally once a week. Sundays    . Fexofenadine HCl (ALLEGRA PO) Take 1 tablet by mouth every morning. Reported on 01/16/2016    . ipratropium (ATROVENT) 0.03 % nasal spray Place 2-3 sprays into both nostrils daily.    Marland Kitchen levothyroxine (SYNTHROID, LEVOTHROID) 112 MCG tablet Take 112 mcg by mouth daily before breakfast.     . lisinopril (PRINIVIL,ZESTRIL) 40 MG tablet Take 1 tablet (40 mg total) by mouth daily. 30 tablet 11  . magnesium oxide (MAG-OX) 400 MG tablet Take 1-2 tablets by mouth daily.    . Multiple Vitamins-Minerals (CENTRUM SILVER ULTRA WOMENS) TABS Take 1 tablet  by mouth every morning.     . Multiple Vitamins-Minerals (PRESERVISION/LUTEIN PO) Take 1 tablet by mouth 2 (two) times daily.    . predniSONE (DELTASONE) 10 MG tablet Taper  0  . promethazine-dextromethorphan (PROMETHAZINE-DM) 6.25-15 MG/5ML syrup Taper  0   Current Facility-Administered Medications on File Prior to Visit  Medication Dose Route Frequency Provider Last Rate Last Dose  . diphenhydrAMINE (BENADRYL) injection 25 mg  25 mg Intravenous Once Jettie Booze, MD      . famotidine (PEPCID) 20 mg in sodium chloride 0.9 % 50 mL IVPB  20 mg Intravenous Once Jettie Booze, MD        Allergies  Allergen Reactions  . Trazodone And Nefazodone Other (See Comments)    Chest pain  . Codeine Nausea And Vomiting  . Contrast Media [Iodinated Diagnostic Agents]     Rash  - given during an eye procedure  . Cortisone Other (See Comments) and Swelling    Body swelling. Has taken other steroids Body swelling. Has taken other steroids  . Dyclonine Other (See Comments)    Pt. States she experienced a seizure after taking this medication. Pt. States she experienced a seizure after taking this medication.  . Fesoterodine Other (See Comments)    Dehydration and confusion  . Lisinopril Cough and Other (See Comments)  . [employer]     unknown  . Toviaz [Fesoterodine Fumarate] Other (See Comments)    Dehydration and confusion  . Astelin Rash  . Azelastine Rash  . Azithromycin Rash  . Cefuroxime Axetil Rash  . Ciprofloxacin Rash  . Estradiol Rash  . Metrizamide Rash    Rash - given during an eye procedure  . Ondansetron Rash  . Zithromax [Azithromycin Dihydrate] Rash  . Zofran Rash    Blood pressure 136/64, pulse 70, height 5\' 2"  (1.575 m), weight 145 lb (65.8 kg), SpO2 97 %.  Essential hypertension, benign Blood pressure is well controlled today but patient reports cough with lisinopril. She refused all ARBs due to recent recalls and fear to potential harm. Will not accept amlodipine or HCTZ due to potential for hair loss and will like to avoid all medication that may cause weight gain. Patient likes to "reseach" all her medication online and  We discussed the less than 1% probability for hair loss with diuretic, but she still refused trial.    Nifedipine, minoxidil, and doxazosin options with mechanism of action, advantages and potential ADR were discussed during office visit. Patient prefer not to decide change in therapy today. She will call back in 48hrs to determine if she prefers minoxidil or doxazosin to replace current therapy. Patient is also aware to continue lisinopril until alternative therapy selected.  Will need titration every 2-3 weeks until proper BP control achieved.  [name] PharmD, BCPS, [place] [id number] 11/24/2017 1:18 PM

## 2017-11-24 ENCOUNTER — Encounter: Payer: Self-pay | Admitting: Pharmacist

## 2017-11-24 ENCOUNTER — Telehealth: Payer: Self-pay | Admitting: Pharmacist

## 2017-11-24 MED ORDER — MINOXIDIL 2.5 MG PO TABS: 2.5000 mg | ORAL_TABLET | Freq: Two times a day (BID) | ORAL | 0 refills | Status: DC

## 2017-11-24 NOTE — Assessment & Plan Note (Addendum)
Blood pressure is well controlled today but patient reports cough with lisinopril. She refused all ARBs due to recent recalls and fear to potential harm. Will not accept amlodipine or HCTZ due to potential for hair loss and will like to avoid all medication that may cause weight gain. Patient likes to "reseach" all her medication online and  We discussed the less than 1% probability for hair loss with diuretic, but she still refused trial.    Nifedipine, minoxidil, and doxazosin options with mechanism of action, advantages and potential ADR were discussed during office visit. Patient prefer not to decide change in therapy today. She will call back in 48hrs to determine if she prefers minoxidil or doxazosin to replace current therapy. Patient is also aware to continue lisinopril until alternative therapy selected.  Will need titration every 2-3 weeks until proper BP control achieved.

## 2017-11-24 NOTE — Telephone Encounter (Signed)
Patient called prefer pharmacy ([facility]) to inquire on recent recalls for doxazosin, nifedipine or minoxidil.   Only medication without a recent recall was minoxidil. She is aware of need for further titration and will continue to monitor BP 3x/week.   New orders:  1. D/C lisinopril  2. Start minoxidil 2.5mg  BID  3. F/U with HTN clinic in 3 weeks

## 2017-12-08 DIAGNOSIS — M1711 Unilateral primary osteoarthritis, right knee: Secondary | ICD-10-CM | POA: Diagnosis not present

## 2017-12-08 DIAGNOSIS — D510 Vitamin B12 deficiency anemia due to intrinsic factor deficiency: Secondary | ICD-10-CM | POA: Diagnosis not present

## 2017-12-16 ENCOUNTER — Ambulatory Visit (INDEPENDENT_AMBULATORY_CARE_PROVIDER_SITE_OTHER): Payer: PPO | Admitting: Pharmacist

## 2017-12-16 ENCOUNTER — Other Ambulatory Visit: Payer: Self-pay | Admitting: Interventional Cardiology

## 2017-12-16 VITALS — BP 152/70 | HR 103

## 2017-12-16 DIAGNOSIS — I10 Essential (primary) hypertension: Secondary | ICD-10-CM

## 2017-12-16 DIAGNOSIS — M25561 Pain in right knee: Secondary | ICD-10-CM | POA: Diagnosis not present

## 2017-12-16 MED ORDER — SPIRONOLACTONE 25 MG PO TABS: 25.0000 mg | ORAL_TABLET | Freq: Every day | ORAL | 11 refills | Status: DC

## 2017-12-16 NOTE — Progress Notes (Signed)
Patient ID: Rose Moody                 DOB: 1940-10-26                      MRN: [id number]     HPI: Rose Moody is a 77 y.o. female referred by Dr. Irish Lack to HTN clinic. PMH includes HTN with medication intolerances and hypothyroidism, negative for renal artery stenosis. She prefers not to take any product produced in [place] or [place], or with a potential for weight gain or hair loss. At last visit, her lisinopril was discontinued due to cough and she was started on minoxidil 2.5mg  BID.  Pt has been taking her minoxidil, however she has been experiencing swelling in her ankles. She has had occasional dizziness when she stands, denies blurred vision, falls, or headaches. She does not use NSAIDs. Home BP readings have ranged 120-140/60-80s, HR 60-90. She ideally prefers her systolic BP to be in the 253G. She takes 20mg  of melatonin and 2 Benadryl to fall asleep at night.  Current HTN meds: minoxidil 2.5mg  BID Previously tried: Diovan - recalled, irbesartan - lack efficacy, candesartan - lack efficacy, amlodipine - severe hair, lisinopril - cough BP goal: <130/28mmHg  Family History: Cancer in her mother; Heart disease in her father and sister; Stroke in her mother.  Social History: Reports that has never smoked. she has never used smokeless tobacco. She reports that she does not drink alcohol or use drugs.  Diet: Watches the sodium in her diet. Does not drink caffeinated beverages.  Exercise: Not very active currently, recently had knee surgery. [facility]  Home BP readings: 120-140/60-80s, HR 60-80s  Wt Readings from Last 3 Encounters:  11/22/17 145 lb (65.8 kg)  10/19/17 140 lb 9.6 oz (63.8 kg)  01/30/16 152 lb 11.2 oz (69.3 kg)   BP Readings from Last 3 Encounters:  11/22/17 136/64  10/19/17 122/70  11/23/16 (!) 143/77   Pulse Readings from Last 3 Encounters:  11/22/17 70  10/19/17 67  11/23/16 79    Renal function: CrCl cannot be calculated (Patient's  most recent lab result is older than the maximum 21 days allowed.).  Past Medical History:  Diagnosis Date  . Anemia   . Detached retina   . Hypertension   . Thyroid disease   . UTI (lower urinary tract infection)   . Varicose veins     Current Outpatient Medications on File Prior to Visit  Medication Sig Dispense Refill  . Calcium Carbonate-Vitamin D (CALCIUM + D PO) Take 1 tablet by mouth 2 (two) times daily. Reported on 01/16/2016    . cephALEXin (KEFLEX) 250 MG capsule Take by mouth daily.    . Cholecalciferol (VITAMIN D HIGH POTENCY PO) Take 5,000 Units by mouth daily.    . cyanocobalamin (,VITAMIN B-12,) 1000 MCG/ML injection Inject 1,000 mcg into the muscle every 14 (fourteen) days. Given in doctor's office    . diphenhydrAMINE (BENADRYL) 25 mg capsule Take 50 mg by mouth at bedtime.     Marland Kitchen estradiol (ESTRACE) 0.1 MG/GM vaginal cream Place 1 g vaginally once a week. Sundays    . Fexofenadine HCl (ALLEGRA PO) Take 1 tablet by mouth every morning. Reported on 01/16/2016    . ipratropium (ATROVENT) 0.03 % nasal spray Place 2-3 sprays into both nostrils daily.    Marland Kitchen levothyroxine (SYNTHROID, LEVOTHROID) 112 MCG tablet Take 112 mcg by mouth daily before breakfast.     .  magnesium oxide (MAG-OX) 400 MG tablet Take 1-2 tablets by mouth daily.    . minoxidil (LONITEN) 2.5 MG tablet Take 1 tablet (2.5 mg total) by mouth 2 (two) times daily. 60 tablet 0  . Multiple Vitamins-Minerals (CENTRUM SILVER ULTRA WOMENS) TABS Take 1 tablet by mouth every morning.     . Multiple Vitamins-Minerals (PRESERVISION/LUTEIN PO) Take 1 tablet by mouth 2 (two) times daily.    . predniSONE (DELTASONE) 10 MG tablet Taper  0  . promethazine-dextromethorphan (PROMETHAZINE-DM) 6.25-15 MG/5ML syrup Taper  0   Current Facility-Administered Medications on File Prior to Visit  Medication Dose Route Frequency Provider Last Rate Last Dose  . diphenhydrAMINE (BENADRYL) injection 25 mg  25 mg Intravenous Once Jettie Booze, MD      . famotidine (PEPCID) 20 mg in sodium chloride 0.9 % 50 mL IVPB  20 mg Intravenous Once Jettie Booze, MD        Allergies  Allergen Reactions  . Trazodone And Nefazodone Other (See Comments)    Chest pain  . Codeine Nausea And Vomiting  . Contrast Media [Iodinated Diagnostic Agents]     Rash - given during an eye procedure  . Cortisone Other (See Comments) and Swelling    Body swelling. Has taken other steroids Body swelling. Has taken other steroids  . Dyclonine Other (See Comments)    Pt. States she experienced a seizure after taking this medication. Pt. States she experienced a seizure after taking this medication.  . Fesoterodine Other (See Comments)    Dehydration and confusion  . Lisinopril Cough and Other (See Comments)  . [employer]     unknown  . Toviaz [Fesoterodine Fumarate] Other (See Comments)    Dehydration and confusion  . Astelin Rash  . Azelastine Rash  . Azithromycin Rash  . Cefuroxime Axetil Rash  . Ciprofloxacin Rash  . Estradiol Rash  . Metrizamide Rash    Rash - given during an eye procedure  . Ondansetron Rash  . Zithromax [Azithromycin Dihydrate] Rash  . Zofran Rash     Assessment/Plan:  1. Hypertension - BP elevated in clinic today, higher than home readings and above goal < 130/9mmHg. Will discontinue minoxidil due to tachycardia and Jaella. Will start spironolactone 25mg  daily and f/u with BP check and BMET in 2 weeks. Pt will bring home BP readings and BP cuff to f/u visit.  Megan E. Supple, PharmD, CPP, [facility] [address] [place], [place] [address] Phone: [phone]; Fax: [phone] 12/16/2017 2:42 PM

## 2017-12-16 NOTE — Patient Instructions (Addendum)
STOP taking minoxidil - the swelling in your legs should improve  START taking spironolactone 25mg  once a day in the morning  Continue to check your blood pressure and record your readings  Bring in your home blood pressure cuff and readings to your follow up visit in 2 weeks  Call Ferman Basilio in the blood pressure clinic with any concerns [phone]

## 2017-12-29 ENCOUNTER — Ambulatory Visit (INDEPENDENT_AMBULATORY_CARE_PROVIDER_SITE_OTHER): Payer: PPO | Admitting: Pharmacist

## 2017-12-29 ENCOUNTER — Other Ambulatory Visit: Payer: PPO | Admitting: *Deleted

## 2017-12-29 ENCOUNTER — Encounter: Payer: Self-pay | Admitting: Pharmacist

## 2017-12-29 VITALS — BP 150/76 | HR 75

## 2017-12-29 DIAGNOSIS — I10 Essential (primary) hypertension: Secondary | ICD-10-CM

## 2017-12-29 NOTE — Progress Notes (Signed)
Patient ID: Rose Moody                 DOB: January 18, 1941                      MRN: [id number]     HPI: Rose Moody is a 77 y.o. female referred by Dr. Irish Lack to HTN clinic. PMH includes HTN with medication intolerances and hypothyroidism, negative for renal artery stenosis. She prefers not to take any product produced in [place] or [place], or with a potential for weight gain or hair loss. At last visit, her minoxidil was discontinued due to tachycardia and Natalynn. She was started on spironolactone 25mg  daily and presents today for BP check and BMET.  Pt presents today in good spirits. She reports tolerating her spironolactone well. The swelling in her ankles has improved and her HR has dropped back to the 60-80s at home. She does have concerns about her spironolactone as she read that she would need to avoid many foods that are high in potassium. Discussed keeping a consistent diet and that most patients who take spironolactone do not need to make any dietary adjustments when they start the medication. Reassured her that we will follow her labs to make sure her potassium does not become elevated.  Home readings have improved and most are in the 130/80s. No low readings, 1 high reading of 161/84. She brings her home Omron BP cuff to clinic today, which reads a bit higher at 156/96. This correlates well with clinic BP reading today. She does not report having white coat hypertension in the past.  Current HTN meds: spironolactone 25mg  daily (8AM) Previously tried: Diovan - recalled, irbesartan - lack efficacy, candesartan - lack efficacy, amlodipine - severe hair loss, lisinopril - cough, minoxidil 2.5mg  BID - Winifred and tachycardia BP goal: <130/104mmHg  Family History: Cancer in her mother; Heart disease in her father and sister; Stroke in her mother.  Social History: Reports that has never smoked. she has never used smokeless tobacco. She reports that she does not drink alcohol or use drugs.  Diet:  Watches the sodium in her diet. Does not drink caffeinated beverages.  Exercise: Not very active currently, recently had knee surgery. [facility]  Home BP readings: Mostly 130/80s, no lows. One high of 161/84. HR 60-80s, elevated once at 110.  Wt Readings from Last 3 Encounters:  11/22/17 145 lb (65.8 kg)  10/19/17 140 lb 9.6 oz (63.8 kg)  01/30/16 152 lb 11.2 oz (69.3 kg)   BP Readings from Last 3 Encounters:  12/16/17 (!) 152/70  11/22/17 136/64  10/19/17 122/70   Pulse Readings from Last 3 Encounters:  12/16/17 (!) 103  11/22/17 70  10/19/17 67    Renal function: CrCl cannot be calculated (Patient's most recent lab result is older than the maximum 21 days allowed.).  Past Medical History:  Diagnosis Date  . Anemia   . Detached retina   . Hypertension   . Thyroid disease   . UTI (lower urinary tract infection)   . Varicose veins     Current Outpatient Medications on File Prior to Visit  Medication Sig Dispense Refill  . Calcium Carbonate-Vitamin D (CALCIUM + D PO) Take 1 tablet by mouth 2 (two) times daily. Reported on 01/16/2016    . cephALEXin (KEFLEX) 250 MG capsule Take by mouth daily.    . Cholecalciferol (VITAMIN D HIGH POTENCY PO) Take 5,000 Units by mouth daily.    Marland Kitchen  cyanocobalamin (,VITAMIN B-12,) 1000 MCG/ML injection Inject 1,000 mcg into the muscle every 14 (fourteen) days. Given in doctor's office    . diphenhydrAMINE (BENADRYL) 25 mg capsule Take 50 mg by mouth at bedtime.     Marland Kitchen estradiol (ESTRACE) 0.1 MG/GM vaginal cream Place 1 g vaginally once a week. Sundays    . Fexofenadine HCl (ALLEGRA PO) Take 1 tablet by mouth every morning. Reported on 01/16/2016    . ipratropium (ATROVENT) 0.03 % nasal spray Place 2-3 sprays into both nostrils daily.    Marland Kitchen levothyroxine (SYNTHROID, LEVOTHROID) 112 MCG tablet Take 112 mcg by mouth daily before breakfast.     . magnesium oxide (MAG-OX) 400 MG tablet Take 1-2 tablets by mouth daily.    . Multiple  Vitamins-Minerals (CENTRUM SILVER ULTRA WOMENS) TABS Take 1 tablet by mouth every morning.     . Multiple Vitamins-Minerals (PRESERVISION/LUTEIN PO) Take 1 tablet by mouth 2 (two) times daily.    . predniSONE (DELTASONE) 10 MG tablet Taper  0  . promethazine-dextromethorphan (PROMETHAZINE-DM) 6.25-15 MG/5ML syrup Taper  0  . spironolactone (ALDACTONE) 25 MG tablet Take 1 tablet (25 mg total) by mouth daily. 30 tablet 11   Current Facility-Administered Medications on File Prior to Visit  Medication Dose Route Frequency Provider Last Rate Last Dose  . diphenhydrAMINE (BENADRYL) injection 25 mg  25 mg Intravenous Once Jettie Booze, MD      . famotidine (PEPCID) 20 mg in sodium chloride 0.9 % 50 mL IVPB  20 mg Intravenous Once Jettie Booze, MD        Allergies  Allergen Reactions  . Trazodone And Nefazodone Other (See Comments)    Chest pain  . Minoxidil     Makyra and tachycardia  . Codeine Nausea And Vomiting  . Contrast Media [Iodinated Diagnostic Agents]     Rash - given during an eye procedure  . Cortisone Other (See Comments) and Swelling    Body swelling. Has taken other steroids Body swelling. Has taken other steroids  . Dyclonine Other (See Comments)    Pt. States she experienced a seizure after taking this medication. Pt. States she experienced a seizure after taking this medication.  . Fesoterodine Other (See Comments)    Dehydration and confusion  . Lisinopril Cough and Other (See Comments)  . [employer]     unknown  . Toviaz [Fesoterodine Fumarate] Other (See Comments)    Dehydration and confusion  . Astelin Rash  . Azelastine Rash  . Azithromycin Rash  . Cefuroxime Axetil Rash  . Ciprofloxacin Rash  . Estradiol Rash  . Metrizamide Rash    Rash - given during an eye procedure  . Ondansetron Rash  . Zithromax [Azithromycin Dihydrate] Rash  . Zofran Rash     Assessment/Plan:  1. Hypertension - BP elevated in clinic today above goal  <130/76mmHg, higher than most home readings. Aliayah and tachycardiac have resolved since discontinuing minoxidil. Will check BMET today with recent spironolactone start and if stable, plan to increase spironolactone to 50mg  daily. Will f/u with pt in HTN clinic in 2-3 weeks. Encouraged her to continue to check home BP readings in the mean time.   Annaya Bangert E. Winfrey Chillemi, PharmD, CPP, [facility] [address], [place], [place] [address] Phone: [phone]; Fax: [phone] 12/29/2017 9:30 AM

## 2017-12-29 NOTE — Patient Instructions (Addendum)
It was nice to see you today  We will check your blood work today and if everything is stable, plan to increase your spironolactone to 50mg  once a day and follow up for another blood pressure check in 2-3 weeks

## 2017-12-30 ENCOUNTER — Other Ambulatory Visit: Payer: Self-pay | Admitting: Pharmacist

## 2017-12-30 ENCOUNTER — Telehealth: Payer: Self-pay | Admitting: Interventional Cardiology

## 2017-12-30 ENCOUNTER — Telehealth: Payer: Self-pay | Admitting: Pharmacist

## 2017-12-30 DIAGNOSIS — I10 Essential (primary) hypertension: Secondary | ICD-10-CM

## 2017-12-30 LAB — BASIC METABOLIC PANEL
BUN/Creatinine Ratio: 31 — ABNORMAL HIGH (ref 12–28)
BUN: 25 mg/dL (ref 8–27)
CO2: 25 mmol/L (ref 20–29)
Calcium: 9.7 mg/dL (ref 8.7–10.3)
Chloride: 101 mmol/L (ref 96–106)
Creatinine, Ser: 0.8 mg/dL (ref 0.57–1.00)
GFR calc Af Amer: 81 mL/min/{1.73_m2} (ref >59)
GFR calc non Af Amer: 70 mL/min/{1.73_m2} (ref >59)
Glucose: 73 mg/dL (ref 65–99)
Potassium: 4.6 mmol/L (ref 3.5–5.2)
Sodium: 141 mmol/L (ref 134–144)

## 2017-12-30 MED ORDER — SPIRONOLACTONE 50 MG PO TABS: 50.0000 mg | ORAL_TABLET | Freq: Every day | ORAL | 11 refills | Status: DC

## 2017-12-30 MED ORDER — VALSARTAN 80 MG PO TABS: 80.0000 mg | ORAL_TABLET | Freq: Every day | ORAL | 11 refills | Status: DC

## 2017-12-30 NOTE — Telephone Encounter (Signed)
NEW MESSAGE    Pt c/o medication issue:  1. Name of Medication: spironolactone (ALDACTONE) 50 MG tablet  2. How are you currently taking this medication (dosage and times per day)? Take 1 tablet (50 mg total) by mouth daily.  3. Are you having a reaction (difficulty breathing--STAT)? RASH  4. What is your medication issue? Patient feels the medication is causing rash

## 2017-12-30 NOTE — Telephone Encounter (Signed)
BMET stable since starting spironolactone. Pt aware of results and to increase spironolactone to 50mg  daily for better BP control. Scheduled f/u in 2 weeks for BP check and repeat BMET.

## 2017-12-30 NOTE — Telephone Encounter (Signed)
Follow up   If [facility] is calling please get Coumadin Nurse on the phone STAT  1.  Are you calling in regards to an appointment? no  2.  Are you calling for a refill ? no  3.  Are you having bleeding issues? no 4.  Do you need clearance to hold Coumadin? No  Pt verbalized that she is calling aback about her phone conversation with Leeroy Bock, [facility]  12/30/17 9:44 AM   She stated that she was told to call can and give BP 104/70  and HR  94            Please route to the Coumadin Clinic Pool

## 2017-12-30 NOTE — Telephone Encounter (Signed)
Returned call to patient. She was seen in HTN clinic yesterday for f/u 2 weeks after starting spironolactone. She did not mention a rash or issues with her spironolactone at that time. She states she developed a head to toe bright red rash that appeared today. She checked her BP today after taking just spironolactone 25mg  daily and her systolic BP dropped to 163/84, but increased back to 665 systolic an hour later. She had not taken any of the higher 50mg  doses yet.   Pt has multiple intolerances to BP medications. Will switch back to valsartan since pt tolerated this well and it was only stopped due to recent recall. Verified that [facility] has uncompromised supply of valsartan again. Advised pt she can take Benadryl to help with her rash and to keep f/u appt in HTN clinic in 2 weeks.

## 2017-12-31 DIAGNOSIS — L270 Generalized skin eruption due to drugs and medicaments taken internally: Secondary | ICD-10-CM | POA: Diagnosis not present

## 2018-01-07 DIAGNOSIS — L659 Nonscarring hair loss, unspecified: Secondary | ICD-10-CM | POA: Diagnosis not present

## 2018-01-07 DIAGNOSIS — D510 Vitamin B12 deficiency anemia due to intrinsic factor deficiency: Secondary | ICD-10-CM | POA: Diagnosis not present

## 2018-01-07 DIAGNOSIS — M8580 Other specified disorders of bone density and structure, unspecified site: Secondary | ICD-10-CM | POA: Diagnosis not present

## 2018-01-07 DIAGNOSIS — I10 Essential (primary) hypertension: Secondary | ICD-10-CM | POA: Diagnosis not present

## 2018-01-07 DIAGNOSIS — R413 Other amnesia: Secondary | ICD-10-CM | POA: Diagnosis not present

## 2018-01-13 ENCOUNTER — Ambulatory Visit: Payer: PPO | Admitting: Pharmacist

## 2018-01-13 ENCOUNTER — Other Ambulatory Visit: Payer: PPO | Admitting: *Deleted

## 2018-01-13 VITALS — BP 126/66 | HR 88

## 2018-01-13 DIAGNOSIS — I10 Essential (primary) hypertension: Secondary | ICD-10-CM

## 2018-01-13 NOTE — Patient Instructions (Signed)
It was nice to see you today  Continue taking valsartan 80mg  daily  Call Jenney Brester in the blood pressure clinic with any concerns [phone]  Your blood pressure goal is < 130/70mmHg  The number for billing is: [phone]

## 2018-01-13 NOTE — Progress Notes (Signed)
Patient ID: Rose Moody                 DOB: Jan 21, 1941                      MRN: [id number]     HPI: Rose Moody is a 77 y.o. female referred by Dr. Irish Lack to HTN clinic. PMH includes HTN with medication intolerances and hypothyroidism, negative for renal artery stenosis. She prefers not to take any product produced in [place] or [place], or with a potential for weight gain or hair loss. At last visit, her minoxidil was discontinued due to tachycardia and Keven. She was started on spironolactone 25mg , however after 2 weeks developed a diffuse bright red rash on her entire body. Spironolactone was discontinued and valsartan was resumed (previously discontinued due to recall). She has a history of multiple medication intolerances.  Pt presents today in good spirits. Her rash has disappeared since stopping spironolactone. Her home BP readings have been excellent and home Omron BP cuff was previously calibrated in clinic and is accurate. She denies dizziness, blurred vision, falls, headaches, or swelling.  Current HTN meds: valsartan 80mg  daily Previously tried: Diovan - recalled, irbesartan - lack efficacy, candesartan - lack efficacy, amlodipine - severe hair loss, lisinopril - cough, minoxidil 2.5mg  BID - Leanah and tachycardia, spironolactone - full body rash BP goal: <130/88mmHg  Family History: Cancer in her mother; Heart disease in her father and sister; Stroke in her mother.  Social History: Reports that has never smoked. she has never used smokeless tobacco. She reports that she does not drink alcohol or use drugs.  Diet: Watches the sodium in her diet. Does not drink caffeinated beverages.  Exercise: Not very active currently, recently had knee surgery. [facility]  Home BP readings: 103/54 to 130/82 mostly, 118/70, 125/85, 120/64, 108/75, 127/77, 134/79, 148/81  Wt Readings from Last 3 Encounters:  11/22/17 145 lb (65.8 kg)  10/19/17 140 lb 9.6 oz (63.8 kg)  01/30/16  152 lb 11.2 oz (69.3 kg)   BP Readings from Last 3 Encounters:  12/29/17 (!) 150/76  12/16/17 (!) 152/70  11/22/17 136/64   Pulse Readings from Last 3 Encounters:  12/29/17 75  12/16/17 (!) 103  11/22/17 70    Renal function: CrCl cannot be calculated (Unknown ideal weight.).  Past Medical History:  Diagnosis Date  . Anemia   . Detached retina   . Hypertension   . Thyroid disease   . UTI (lower urinary tract infection)   . Varicose veins     Current Outpatient Medications on File Prior to Visit  Medication Sig Dispense Refill  . Calcium Carbonate-Vitamin D (CALCIUM + D PO) Take 1 tablet by mouth 2 (two) times daily. Reported on 01/16/2016    . cephALEXin (KEFLEX) 250 MG capsule Take by mouth daily.    . Cholecalciferol (VITAMIN D HIGH POTENCY PO) Take 5,000 Units by mouth daily.    . cyanocobalamin (,VITAMIN B-12,) 1000 MCG/ML injection Inject 1,000 mcg into the muscle every 14 (fourteen) days. Given in doctor's office    . diphenhydrAMINE (BENADRYL) 25 mg capsule Take 50 mg by mouth at bedtime.     Marland Kitchen estradiol (ESTRACE) 0.1 MG/GM vaginal cream Place 1 g vaginally once a week. Sundays    . Fexofenadine HCl (ALLEGRA PO) Take 1 tablet by mouth every morning. Reported on 01/16/2016    . ipratropium (ATROVENT) 0.03 % nasal spray Place 2-3 sprays into both  nostrils daily.    Marland Kitchen levothyroxine (SYNTHROID, LEVOTHROID) 112 MCG tablet Take 112 mcg by mouth daily before breakfast.     . magnesium oxide (MAG-OX) 400 MG tablet Take 1-2 tablets by mouth daily.    . Multiple Vitamins-Minerals (CENTRUM SILVER ULTRA WOMENS) TABS Take 1 tablet by mouth every morning.     . Multiple Vitamins-Minerals (PRESERVISION/LUTEIN PO) Take 1 tablet by mouth 2 (two) times daily.    . predniSONE (DELTASONE) 10 MG tablet Taper  0  . promethazine-dextromethorphan (PROMETHAZINE-DM) 6.25-15 MG/5ML syrup Taper  0  . valsartan (DIOVAN) 80 MG tablet Take 1 tablet (80 mg total) by mouth daily. 30 tablet 11   Current  Facility-Administered Medications on File Prior to Visit  Medication Dose Route Frequency Provider Last Rate Last Dose  . diphenhydrAMINE (BENADRYL) injection 25 mg  25 mg Intravenous Once Jettie Booze, MD      . famotidine (PEPCID) 20 mg in sodium chloride 0.9 % 50 mL IVPB  20 mg Intravenous Once Jettie Booze, MD        Allergies  Allergen Reactions  . Trazodone And Nefazodone Other (See Comments)    Chest pain  . Minoxidil     Eldena and tachycardia  . Codeine Nausea And Vomiting  . Contrast Media [Iodinated Diagnostic Agents]     Rash - given during an eye procedure  . Cortisone Other (See Comments) and Swelling    Body swelling. Has taken other steroids Body swelling. Has taken other steroids  . Dyclonine Other (See Comments)    Pt. States she experienced a seizure after taking this medication. Pt. States she experienced a seizure after taking this medication.  . Fesoterodine Other (See Comments)    Dehydration and confusion  . Lisinopril Cough and Other (See Comments)  . Spironolactone     Full body rash  . [employer]     unknown  . Toviaz [Fesoterodine Fumarate] Other (See Comments)    Dehydration and confusion  . Astelin Rash  . Azelastine Rash  . Azithromycin Rash  . Cefuroxime Axetil Rash  . Ciprofloxacin Rash  . Estradiol Rash  . Metrizamide Rash    Rash - given during an eye procedure  . Ondansetron Rash  . Zithromax [Azithromycin Dihydrate] Rash  . Zofran Rash     Assessment/Plan:  1. Hypertension - BP excellent in clinic today since resuming valsartan and is at goal <130/58mmHg. Rash has disappeared since stopping spironolactone. Will continue valsartan 80mg  daily and check BMET today. F/u in HTN clinic as needed.  Rose Moody, PharmD, CPP, [facility] [address] [place], [place] [address] Phone: [phone]; Fax: [phone] 01/13/2018 1:01 PM

## 2018-01-14 LAB — BASIC METABOLIC PANEL
BUN/Creatinine Ratio: 28 (ref 12–28)
BUN: 29 mg/dL — ABNORMAL HIGH (ref 8–27)
CO2: 23 mmol/L (ref 20–29)
Calcium: 9.5 mg/dL (ref 8.7–10.3)
Chloride: 105 mmol/L (ref 96–106)
Creatinine, Ser: 1.05 mg/dL — ABNORMAL HIGH (ref 0.57–1.00)
GFR calc Af Amer: 58 mL/min/{1.73_m2} — ABNORMAL LOW (ref >59)
GFR calc non Af Amer: 51 mL/min/{1.73_m2} — ABNORMAL LOW (ref >59)
Glucose: 110 mg/dL — ABNORMAL HIGH (ref 65–99)
Potassium: 4.1 mmol/L (ref 3.5–5.2)
Sodium: 142 mmol/L (ref 134–144)

## 2018-02-07 DIAGNOSIS — D510 Vitamin B12 deficiency anemia due to intrinsic factor deficiency: Secondary | ICD-10-CM | POA: Diagnosis not present

## 2018-02-09 DIAGNOSIS — L659 Nonscarring hair loss, unspecified: Secondary | ICD-10-CM | POA: Diagnosis not present

## 2018-02-09 DIAGNOSIS — E039 Hypothyroidism, unspecified: Secondary | ICD-10-CM | POA: Diagnosis not present

## 2018-02-09 DIAGNOSIS — Z6829 Body mass index (BMI) 29.0-29.9, adult: Secondary | ICD-10-CM | POA: Diagnosis not present

## 2018-03-04 DIAGNOSIS — D510 Vitamin B12 deficiency anemia due to intrinsic factor deficiency: Secondary | ICD-10-CM | POA: Diagnosis not present

## 2018-03-16 ENCOUNTER — Encounter: Payer: Self-pay | Admitting: Neurology

## 2018-03-16 ENCOUNTER — Ambulatory Visit (INDEPENDENT_AMBULATORY_CARE_PROVIDER_SITE_OTHER): Payer: PPO | Admitting: Neurology

## 2018-03-16 ENCOUNTER — Encounter

## 2018-03-16 VITALS — BP 130/71 | HR 69 | Ht 62.5 in | Wt 144.0 lb

## 2018-03-16 DIAGNOSIS — G9340 Encephalopathy, unspecified: Secondary | ICD-10-CM | POA: Diagnosis not present

## 2018-03-16 DIAGNOSIS — R413 Other amnesia: Secondary | ICD-10-CM

## 2018-03-16 NOTE — Patient Instructions (Addendum)
MRI brain  Labs  FDG PET Scan for Dementia Formal neurocognitive testing  Memory Compensation Strategies  1. Use "WARM" strategy.  W= write it down  A= associate it  R= repeat it  M= make a mental note  2.   You can keep a Social worker.  Use a 3-ring notebook with sections for the following: calendar, important names and phone numbers,  medications, doctors' names/phone numbers, lists/reminders, and a section to journal what you did  each day.   3.    Use a calendar to write appointments down.  4.    Write yourself a schedule for the day.  This can be placed on the calendar or in a separate section of the Memory Notebook.  Keeping a  regular schedule can help memory.  5.    Use medication organizer with sections for each day or morning/evening pills.  You may need help loading it  6.    Keep a basket, or pegboard by the door.  Place items that you need to take out with you in the basket or on the pegboard.  You may also want to  include a message board for reminders.  7.    Use sticky notes.  Place sticky notes with reminders in a place where the task is performed.  For example: " turn off the  stove" placed by the stove, "lock the door" placed on the door at eye level, " take your medications" on  the bathroom mirror or by the place where you normally take your medications.  8.    Use alarms/timers.  Use while cooking to remind yourself to check on food or as a reminder to take your medicine, or as a  reminder to make a call, or as a reminder to perform another task, etc.

## 2018-03-16 NOTE — Progress Notes (Signed)
[facility]    Provider:  Dr Jaynee Eagles Referring Provider: Lawerance Cruel, MD Primary Care Physician:  Lawerance Cruel, MD  CC:  Memory  HPI:  Rose Moody is a 77 y.o. female here as a referral from Dr. Harrington Challenger for memory problems.  She is accompanied by her granddaughter who also provides much information.  Past medical history hypertension, hypothyroidism, insomnia, osteopenia, vitamin B12 deficiency anemia due to intrinsic factor deficiency, memory difficulty, sleep disorder, atherosclerosis of renal artery, orthostatic lightheadedness.  She is on B12 injections.  She is quite worried about her memory over the last year and developing dementia.  She says in the shower she forgets what she cleaned. Loss of words and not recalling words. Started last year. Much worse this year. Started in the setting of stress. The stress resolved and the memory issues have still worsened. Granddaughter provides information, she forgets conversations, she repeats same things in the same day and she forgets recent events. More short-term memory but also fluency difficulty. She lives with her daughter. She takes care of her own bills, hasn't missed appointments. If someone calls she may forget. Not forgetting names or anniversaries or birthdays, she still drives and no accidents in the home or in the car. Also "loss of words", she has difficulty getting words out or can't get the word out or think of the word. She gets frustrated about it. No FHx of dementia. Mother was 76 without issue, father dies at 15. She sees an endocrinologist recently.No other focal neurologic deficits, associated symptoms, inciting events or modifiable factors.  Reviewed notes, labs and imaging from outside physicians, which showed:  Reviewed referring physician notes, she reported her memory is "horrible" and recognizes that this may be polypharmacy her medications but worried enough she wanted to see neurology.  She is  felt a little depressed a few times but feels well controlled otherwise, she has regular B12 shots, lives with her daughter, B12 deficiency due to pernicious anemia, appears to be very compliant with her B12 shots going back to at least 2018.    Review of Systems: Patient complains of symptoms per HPI as well as the following symptoms: Memory loss, feeling cold, incontinence, joint pain, joint swelling, runny nose. Pertinent negatives and positives per HPI. All others negative.   Social History   Socioeconomic History  . Marital status: Divorced    Spouse name: Not on file  . Number of children: 2  . Years of education: 24  . Highest education level: Not on file  Occupational History  . Not on file  Social Needs  . Financial resource strain: Not on file  . Food insecurity:    Worry: Not on file    Inability: Not on file  . Transportation needs:    Medical: Not on file    Non-medical: Not on file  Tobacco Use  . Smoking status: Never Smoker  . Smokeless tobacco: Never Used  Substance and Sexual Activity  . Alcohol use: Never    Alcohol/week: 0.0 oz    Frequency: Never  . Drug use: Never  . Sexual activity: Not on file  Lifestyle  . Physical activity:    Days per week: Not on file    Minutes per session: Not on file  . Stress: Not on file  Relationships  . Social connections:    Talks on phone: Not on file    Gets together: Not on file    Attends religious service: Not on file  Active member of club or organization: Not on file    Attends meetings of clubs or organizations: Not on file    Relationship status: Not on file  . Intimate partner violence:    Fear of current or ex partner: Not on file    Emotionally abused: Not on file    Physically abused: Not on file    Forced sexual activity: Not on file  Other Topics Concern  . Not on file  Social History Narrative   She lives at home with her daughter. They share a town home.    No caffeine   Right handed     Family History  Problem Relation Age of Onset  . Cancer Mother        leukemia  . Stroke Mother   . Melanoma Mother   . Heart disease Father   . Heart disease Sister   . Congestive Heart Failure Sister   . Pneumonia Sister   . Bone cancer Brother   . Pneumonia Sister   . Rheum arthritis Brother   . Heart disease Brother   . Heart disease Brother        open heart surgery  . Dementia Neg Hx     Past Medical History:  Diagnosis Date  . Anemia   . Atherosclerosis of renal artery ([place])   . Detached retina   . Fibromyalgia   . Hypertension   . Hypothyroidism   . Incontinence   . Macular degeneration   . Osteoarthritis    knees and back  . Pernicious anemia   . Stomach ulcer    in her 9s  . Thyroid disease   . UTI (lower urinary tract infection)   . Varicose veins   . Vitamin D deficiency     Past Surgical History:  Procedure Laterality Date  . ABDOMINAL HYSTERECTOMY  age 1  . APPENDECTOMY  age 38  . BLADDER SUSPENSION  age 56  . COLONOSCOPY    . ENDOVENOUS ABLATION SAPHENOUS VEIN W/ LASER Left 01-16-2016   endovenous laser ablation left small saphenous vein by Curt Jews MD  . laser surgery for retinal detachment Left 2016   Dr. Zigmund Daniel  . RENAL ANGIOGRAM N/A 01/28/2012   Procedure: RENAL ANGIOGRAM;  Surgeon: Jettie Booze, MD;  Location: [facility] CATH LAB;  Service: Cardiovascular;  Laterality: N/A;  . stab phlebectomy Right 12-08-2012   > 20 incisions by Curt Jews MD    Current Outpatient Medications  Medication Sig Dispense Refill  . Calcium Carbonate-Vitamin D (CALCIUM + D PO) Take 1 tablet by mouth 2 (two) times daily. Reported on 01/16/2016    . cephALEXin (KEFLEX) 250 MG capsule Take 250 mg by mouth every evening.    . cyanocobalamin (,VITAMIN B-12,) 1000 MCG/ML injection Inject 1,000 mcg into the muscle every 30 (thirty) days. Given in doctor's office     . diphenhydrAMINE (BENADRYL) 25 mg capsule Take 50 mg by mouth at bedtime.     Marland Kitchen estradiol  (ESTRACE) 0.1 MG/GM vaginal cream Place 1 g vaginally once a week. Sundays    . ipratropium (ATROVENT) 0.03 % nasal spray Place 2-3 sprays into both nostrils daily.    Marland Kitchen levothyroxine (SYNTHROID, LEVOTHROID) 112 MCG tablet Take 112 mcg by mouth daily before breakfast.     . Multiple Vitamins-Minerals (CENTRUM SILVER ULTRA WOMENS) TABS Take 1 tablet by mouth every morning.     . Multiple Vitamins-Minerals (PRESERVISION/LUTEIN PO) Take 1 tablet by mouth 2 (two) times daily.    Marland Kitchen  valsartan (DIOVAN) 80 MG tablet Take 1 tablet (80 mg total) by mouth daily. 30 tablet 11  . magnesium oxide (MAG-OX) 400 MG tablet Take 1-2 tablets by mouth daily.     No current facility-administered medications for this visit.    Facility-Administered Medications Ordered in Other Visits  Medication Dose Route Frequency Provider Last Rate Last Dose  . diphenhydrAMINE (BENADRYL) injection 25 mg  25 mg Intravenous Once Jettie Booze, MD      . famotidine (PEPCID) 20 mg in sodium chloride 0.9 % 50 mL IVPB  20 mg Intravenous Once Jettie Booze, MD        Allergies as of 03/16/2018 - Review Complete 03/16/2018  Allergen Reaction Noted  . Trazodone and nefazodone Other (See Comments) 10/19/2017  . Minoxidil  12/16/2017  . Codeine Nausea And Vomiting 04/07/2011  . Contrast media [iodinated diagnostic agents]  01/27/2012  . Cortisone Other (See Comments) and Swelling 04/07/2011  . Dyclonine Other (See Comments) 05/13/2011  . Fesoterodine Other (See Comments) 04/07/2011  . Lisinopril Cough and Other (See Comments) 04/07/2011  . Spironolactone  12/30/2017  . Tequila sunrise flavor  04/07/2011  . Toviaz [fesoterodine fumarate] Other (See Comments) 04/07/2011  . Astelin Rash 04/07/2011  . Azelastine Rash 04/07/2011  . Azithromycin Rash 04/07/2011  . Cefuroxime axetil Rash 04/07/2011  . Ciprofloxacin Rash 01/27/2012  . Estradiol Rash 01/27/2012  . Metrizamide Rash 01/27/2012  . Ondansetron Rash 04/07/2011    . Zithromax [azithromycin dihydrate] Rash 04/07/2011  . Zofran Rash 04/07/2011    Vitals: BP 130/71 (BP Location: Left Arm, Patient Position: Sitting)   Pulse 69   Ht 5' 2.5" (1.588 m)   Wt 144 lb (65.3 kg)   BMI 25.92 kg/m  Last Weight:  Wt Readings from Last 1 Encounters:  03/16/18 144 lb (65.3 kg)   Last Height:   Ht Readings from Last 1 Encounters:  03/16/18 5' 2.5" (1.588 m)   Physical exam: Exam: Gen: NAD, conversant, well nourised, well groomed                     CV: RRR, no MRG. No Carotid Bruits. No peripheral edema, warm, nontender Eyes: Conjunctivae clear without exudates or hemorrhage  Neuro: Detailed Neurologic Exam  Speech:    Speech is normal; fluent and spontaneous with normal comprehension.  Cognition:  MMSE - Mini Mental State Exam 03/16/2018  Orientation to time 5  Orientation to Place 5  Registration 3  Attention/ Calculation 5  Recall 3  Language- name 2 objects 2  Language- repeat 1  Language- follow 3 step command 3  Language- read & follow direction 1  Write a sentence 1  Copy design 0  Total score 29       The patient is oriented to person, place, and time;     recent and remote memory intact;     language fluent;     normal attention, concentration,     fund of knowledge Cranial Nerves: Mild hypomimia    The pupils are equal, round, and reactive to light.  Attempted funduscopic exam could not visualize due to small pupils.  Visual fields are full to finger confrontation. Extraocular movements are intact. Trigeminal sensation is intact and the muscles of mastication are normal. The face is symmetric. The palate elevates in the midline. Hearing intact. Voice is normal. Shoulder shrug is normal. The tongue has normal motion without fasciculations.   Coordination:    No dysmetria  Gait:  Not ataxic, mild dec arm swing, stable  Motor Observation:    No asymmetry, no atrophy, and no involuntary movements noted. Tone:    Normal  muscle tone.    Posture:     Slightly stooped, Mild decr arm swing    Strength:    Strength is: prox > distal weakness 4-4+ some poor effort but symmetric     Sensation: intact to LT     Reflex Exam:  DTR's:    Right aj absent, she denied patellars, uppers brisk  Toes:    The toes are equiv bilaterally.   Clonus:    Clonus is absent.     Assessment/Plan:  77 y.o. female here as a referral from Dr. Harrington Challenger for memory problems.  She is accompanied by her granddaughter who also provides much information.  Past medical history hypertension, hypothyroidism, insomnia, osteopenia, vitamin B12 deficiency anemia due to intrinsic factor deficiency, memory difficulty, sleep disorder, atherosclerosis of renal artery, orthostatic lightheadedness.  She is here with her granddaughter and she is quite worried about her short-term memory loss and concerns of fluency of speech.  MMSE 29 out of 30.  We will check labs today MRI brain w/wo contrast for reversible causes of dementia FDG PET Scan for Dementia: Patient has concerns of short-term memory loss and fluency of speech needs scan to evaluate between Alzheimer's versus frontotemporal variant Formal neurocognitive testing  Orders Placed This Encounter  Procedures  . MR BRAIN W WO CONTRAST  . NM PET Metabolic Brain  . Homocysteine  . Vitamin B1  . RPR  . Basic Metabolic Panel   Cc: Dr. Lovena Le, MD  [facility] [address] [place], [place] [address]  Phone [phone] Fax [phone]

## 2018-03-17 ENCOUNTER — Encounter: Payer: Self-pay | Admitting: Neurology

## 2018-03-17 ENCOUNTER — Telehealth: Payer: Self-pay | Admitting: Neurology

## 2018-03-17 NOTE — Telephone Encounter (Signed)
Health Team NPR, patient sent to [place]. DW

## 2018-03-19 LAB — BASIC METABOLIC PANEL
BUN/Creatinine Ratio: 29 — ABNORMAL HIGH (ref 12–28)
BUN: 28 mg/dL — ABNORMAL HIGH (ref 8–27)
CO2: 24 mmol/L (ref 20–29)
Calcium: 9.5 mg/dL (ref 8.7–10.3)
Chloride: 101 mmol/L (ref 96–106)
Creatinine, Ser: 0.95 mg/dL (ref 0.57–1.00)
GFR calc Af Amer: 66 mL/min/{1.73_m2} (ref >59)
GFR calc non Af Amer: 57 mL/min/{1.73_m2} — ABNORMAL LOW (ref >59)
Glucose: 81 mg/dL (ref 65–99)
Potassium: 4.4 mmol/L (ref 3.5–5.2)
Sodium: 140 mmol/L (ref 134–144)

## 2018-03-19 LAB — VITAMIN B1: Thiamine: 159.2 nmol/L (ref 66.5–200.0)

## 2018-03-19 LAB — RPR: RPR Ser Ql: NONREACTIVE

## 2018-03-19 LAB — HOMOCYSTEINE: Homocysteine: 12.5 umol/L (ref 0.0–15.0)

## 2018-03-21 ENCOUNTER — Telehealth: Payer: Self-pay | Admitting: *Deleted

## 2018-03-21 NOTE — Telephone Encounter (Addendum)
Called pt and let her know that her labs showed some mild dehydration but were otherwise unremarkable. Encouraged pt to stay hydrated. Pt verbalized understanding and her questions were answered.    ----- Message from Melvenia Beam, MD sent at 03/19/2018  8:58 AM EDT ----- Mildly dehydrated otherwise unremarkable thanks

## 2018-03-22 DIAGNOSIS — L600 Ingrowing nail: Secondary | ICD-10-CM | POA: Diagnosis not present

## 2018-03-22 DIAGNOSIS — M79674 Pain in right toe(s): Secondary | ICD-10-CM | POA: Diagnosis not present

## 2018-03-22 DIAGNOSIS — L84 Corns and callosities: Secondary | ICD-10-CM | POA: Diagnosis not present

## 2018-03-22 DIAGNOSIS — M79675 Pain in left toe(s): Secondary | ICD-10-CM | POA: Diagnosis not present

## 2018-03-30 DIAGNOSIS — E039 Hypothyroidism, unspecified: Secondary | ICD-10-CM | POA: Diagnosis not present

## 2018-03-30 DIAGNOSIS — D510 Vitamin B12 deficiency anemia due to intrinsic factor deficiency: Secondary | ICD-10-CM | POA: Diagnosis not present

## 2018-03-30 DIAGNOSIS — J329 Chronic sinusitis, unspecified: Secondary | ICD-10-CM | POA: Diagnosis not present

## 2018-03-31 ENCOUNTER — Telehealth: Payer: Self-pay | Admitting: Neurology

## 2018-03-31 NOTE — Telephone Encounter (Signed)
Spoke with Dr. Jaynee Eagles. Will make note of this and it may be seen on MRI however pt not showing symptoms of CSF leak. Will see what MRI says.

## 2018-03-31 NOTE — Telephone Encounter (Signed)
Called pt and discussed with her that Dr Jaynee Eagles made note of this and will see what MRI shows. It may been seen on MRI however pt doesn't have other symptoms of CSF leak. We will certainly keep this in mind and will see what MRI shows. Pt's questions answered and RN discussed pt mentioning this to her PCP since it has gone on for so long. She said her PCP put her on a nasal spray last year. RN advised to keep them updated in case pt needs to see any specialists. Again will see what MRI shows. Pt appreciative and had no further questions or concerns.
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
## 2018-04-06 ENCOUNTER — Inpatient Hospital Stay: Admission: RE | Admit: 2018-04-06 | Payer: PPO | Source: Ambulatory Visit

## 2018-04-07 DIAGNOSIS — N3020 Other chronic cystitis without hematuria: Secondary | ICD-10-CM | POA: Diagnosis not present

## 2018-04-07 DIAGNOSIS — R351 Nocturia: Secondary | ICD-10-CM | POA: Diagnosis not present

## 2018-04-13 DIAGNOSIS — D510 Vitamin B12 deficiency anemia due to intrinsic factor deficiency: Secondary | ICD-10-CM | POA: Diagnosis not present

## 2018-04-13 DIAGNOSIS — E039 Hypothyroidism, unspecified: Secondary | ICD-10-CM | POA: Diagnosis not present

## 2018-04-13 DIAGNOSIS — J329 Chronic sinusitis, unspecified: Secondary | ICD-10-CM | POA: Diagnosis not present

## 2018-04-26 ENCOUNTER — Telehealth: Payer: Self-pay | Admitting: Neurology

## 2018-04-26 NOTE — Telephone Encounter (Signed)
Pt said she is returning Paia call. Please call

## 2018-04-26 NOTE — Telephone Encounter (Signed)
Called patient back she wanted me to let Dr. Jaynee Eagles she may change change her mind she will have to think about it some more.

## 2018-04-26 NOTE — Telephone Encounter (Signed)
04/26/2018 -- Patient has declined test for Now. PET Scan. Jayme Cloud

## 2018-05-02 DIAGNOSIS — D510 Vitamin B12 deficiency anemia due to intrinsic factor deficiency: Secondary | ICD-10-CM | POA: Diagnosis not present

## 2018-05-05 ENCOUNTER — Encounter (HOSPITAL_COMMUNITY): Payer: PPO

## 2018-05-26 DIAGNOSIS — D510 Vitamin B12 deficiency anemia due to intrinsic factor deficiency: Secondary | ICD-10-CM | POA: Diagnosis not present

## 2018-05-26 DIAGNOSIS — Z23 Encounter for immunization: Secondary | ICD-10-CM | POA: Diagnosis not present

## 2018-06-14 DIAGNOSIS — Z85828 Personal history of other malignant neoplasm of skin: Secondary | ICD-10-CM | POA: Diagnosis not present

## 2018-06-14 DIAGNOSIS — Z23 Encounter for immunization: Secondary | ICD-10-CM | POA: Diagnosis not present

## 2018-06-14 DIAGNOSIS — L814 Other melanin hyperpigmentation: Secondary | ICD-10-CM | POA: Diagnosis not present

## 2018-06-14 DIAGNOSIS — L821 Other seborrheic keratosis: Secondary | ICD-10-CM | POA: Diagnosis not present

## 2018-06-14 DIAGNOSIS — Z808 Family history of malignant neoplasm of other organs or systems: Secondary | ICD-10-CM | POA: Diagnosis not present

## 2018-06-14 DIAGNOSIS — D485 Neoplasm of uncertain behavior of skin: Secondary | ICD-10-CM | POA: Diagnosis not present

## 2018-06-14 DIAGNOSIS — D225 Melanocytic nevi of trunk: Secondary | ICD-10-CM | POA: Diagnosis not present

## 2018-06-14 DIAGNOSIS — L820 Inflamed seborrheic keratosis: Secondary | ICD-10-CM | POA: Diagnosis not present

## 2018-06-27 DIAGNOSIS — D510 Vitamin B12 deficiency anemia due to intrinsic factor deficiency: Secondary | ICD-10-CM | POA: Diagnosis not present

## 2018-06-30 ENCOUNTER — Telehealth: Payer: Self-pay | Admitting: *Deleted

## 2018-06-30 NOTE — Telephone Encounter (Signed)
Received [facility] from [facility] indicating that the valsartan 80 mg is on backorder. They are requesting an alternative. Please advise. Thanks, MI

## 2018-07-01 NOTE — Telephone Encounter (Signed)
Called and spoke to the pharmacist at [facility]. She states that they transferred the patient Rx for valsartan 80 mg QD to [facility] who had a supply of the valsartan. No other actions needed at this time.

## 2018-07-12 DIAGNOSIS — E039 Hypothyroidism, unspecified: Secondary | ICD-10-CM | POA: Diagnosis not present

## 2018-07-12 DIAGNOSIS — I10 Essential (primary) hypertension: Secondary | ICD-10-CM | POA: Diagnosis not present

## 2018-07-12 DIAGNOSIS — I701 Atherosclerosis of renal artery: Secondary | ICD-10-CM | POA: Diagnosis not present

## 2018-07-12 DIAGNOSIS — D510 Vitamin B12 deficiency anemia due to intrinsic factor deficiency: Secondary | ICD-10-CM | POA: Diagnosis not present

## 2018-07-12 DIAGNOSIS — E559 Vitamin D deficiency, unspecified: Secondary | ICD-10-CM | POA: Diagnosis not present

## 2018-07-28 DIAGNOSIS — I701 Atherosclerosis of renal artery: Secondary | ICD-10-CM | POA: Diagnosis not present

## 2018-07-28 DIAGNOSIS — E559 Vitamin D deficiency, unspecified: Secondary | ICD-10-CM | POA: Diagnosis not present

## 2018-07-28 DIAGNOSIS — Z1389 Encounter for screening for other disorder: Secondary | ICD-10-CM | POA: Diagnosis not present

## 2018-07-28 DIAGNOSIS — E039 Hypothyroidism, unspecified: Secondary | ICD-10-CM | POA: Diagnosis not present

## 2018-07-28 DIAGNOSIS — N183 Chronic kidney disease, stage 3 (moderate): Secondary | ICD-10-CM | POA: Diagnosis not present

## 2018-07-28 DIAGNOSIS — I10 Essential (primary) hypertension: Secondary | ICD-10-CM | POA: Diagnosis not present

## 2018-07-28 DIAGNOSIS — D510 Vitamin B12 deficiency anemia due to intrinsic factor deficiency: Secondary | ICD-10-CM | POA: Diagnosis not present

## 2018-07-28 DIAGNOSIS — Z0000 Encounter for general adult medical examination without abnormal findings: Secondary | ICD-10-CM | POA: Diagnosis not present

## 2018-07-28 DIAGNOSIS — M8580 Other specified disorders of bone density and structure, unspecified site: Secondary | ICD-10-CM | POA: Diagnosis not present

## 2018-08-23 DIAGNOSIS — M19049 Primary osteoarthritis, unspecified hand: Secondary | ICD-10-CM | POA: Diagnosis not present

## 2018-08-23 DIAGNOSIS — K219 Gastro-esophageal reflux disease without esophagitis: Secondary | ICD-10-CM | POA: Diagnosis not present

## 2018-08-23 DIAGNOSIS — I10 Essential (primary) hypertension: Secondary | ICD-10-CM | POA: Diagnosis not present

## 2018-08-23 DIAGNOSIS — N183 Chronic kidney disease, stage 3 (moderate): Secondary | ICD-10-CM | POA: Diagnosis not present

## 2018-08-23 DIAGNOSIS — M79606 Pain in leg, unspecified: Secondary | ICD-10-CM | POA: Diagnosis not present

## 2018-08-31 ENCOUNTER — Other Ambulatory Visit: Payer: Self-pay | Admitting: Family Medicine

## 2018-08-31 DIAGNOSIS — M79604 Pain in right leg: Secondary | ICD-10-CM

## 2018-08-31 DIAGNOSIS — M79605 Pain in left leg: Secondary | ICD-10-CM

## 2018-09-02 ENCOUNTER — Ambulatory Visit
Admission: RE | Admit: 2018-09-02 | Discharge: 2018-09-02 | Disposition: A | Discharge status: Home / Self Care | Payer: PPO | Source: Ambulatory Visit | Attending: Family Medicine | Admitting: Family Medicine

## 2018-09-02 DIAGNOSIS — M79605 Pain in left leg: Secondary | ICD-10-CM | POA: Diagnosis not present

## 2018-09-02 DIAGNOSIS — M79604 Pain in right leg: Secondary | ICD-10-CM | POA: Diagnosis not present

## 2018-09-02 DIAGNOSIS — D510 Vitamin B12 deficiency anemia due to intrinsic factor deficiency: Secondary | ICD-10-CM | POA: Diagnosis not present

## 2018-09-14 DIAGNOSIS — K219 Gastro-esophageal reflux disease without esophagitis: Secondary | ICD-10-CM | POA: Diagnosis not present

## 2018-09-14 DIAGNOSIS — R531 Weakness: Secondary | ICD-10-CM | POA: Diagnosis not present

## 2018-09-14 DIAGNOSIS — I10 Essential (primary) hypertension: Secondary | ICD-10-CM | POA: Diagnosis not present

## 2018-09-14 DIAGNOSIS — N183 Chronic kidney disease, stage 3 (moderate): Secondary | ICD-10-CM | POA: Diagnosis not present

## 2018-09-14 DIAGNOSIS — I701 Atherosclerosis of renal artery: Secondary | ICD-10-CM | POA: Diagnosis not present

## 2018-09-16 ENCOUNTER — Encounter: Payer: Self-pay | Admitting: Interventional Cardiology

## 2018-09-16 ENCOUNTER — Ambulatory Visit: Payer: PPO | Admitting: Interventional Cardiology

## 2018-09-16 VITALS — BP 164/74 | HR 80 | Ht 62.5 in | Wt 156.0 lb

## 2018-09-16 DIAGNOSIS — R0609 Other forms of dyspnea: Secondary | ICD-10-CM | POA: Diagnosis not present

## 2018-09-16 DIAGNOSIS — Z8249 Family history of ischemic heart disease and other diseases of the circulatory system: Secondary | ICD-10-CM | POA: Diagnosis not present

## 2018-09-16 DIAGNOSIS — I10 Essential (primary) hypertension: Secondary | ICD-10-CM | POA: Diagnosis not present

## 2018-09-16 DIAGNOSIS — R0600 Dyspnea, unspecified: Secondary | ICD-10-CM

## 2018-09-16 DIAGNOSIS — R531 Weakness: Secondary | ICD-10-CM | POA: Diagnosis not present

## 2018-09-16 DIAGNOSIS — Z01812 Encounter for preprocedural laboratory examination: Secondary | ICD-10-CM

## 2018-09-16 MED ORDER — VALSARTAN 80 MG PO TABS: 80.0000 mg | ORAL_TABLET | Freq: Two times a day (BID) | ORAL | 3 refills | Status: DC

## 2018-09-16 MED ORDER — PREDNISONE 50 MG PO TABS: ORAL_TABLET | ORAL | 0 refills | Status: DC

## 2018-09-16 MED ORDER — METOPROLOL TARTRATE 50 MG PO TABS: ORAL_TABLET | ORAL | 0 refills | Status: DC

## 2018-09-16 NOTE — Progress Notes (Signed)
Cardiology Office Note   Date:  09/16/2018   ID:  Rose Moody, DOB 1941/05/28, MRN [id number]  PCP:  Lawerance Cruel, MD    No chief complaint on file.  HTN  Wt Readings from Last 3 Encounters:  09/16/18 156 lb (70.8 kg)  03/16/18 144 lb (65.3 kg)  11/22/17 145 lb (65.8 kg)       History of Present Illness: Rose Moody is a 78 y.o. female  With HTN.  Was thought to have renal artery stenosis by duplex, but then angiogram was performed.  There is no evidence of renal artery stenosis.  She did have dual arterial supply to both kidneys.  Retired from [employer].   BP was followed last year and well controlled in May 2019.    Since then, she has had high readings at him.  Valsartan was increased from 80 mg to 160 mg.  SHe has had some fatigue/DOE as well. She reports weakness with minimal exertion.  She can feel weak when she raises her arms up in the shower to wash her hair.    She has not been exercising due to cold weather. Weakness predated this.  Denies : Chest pain. Dizziness. Leg edema. Nitroglycerin use. Orthopnea. Palpitations. Paroxysmal nocturnal dyspnea. Syncope.     Past Medical History:  Diagnosis Date  . Anemia   . Atherosclerosis of renal artery ([place])   . Detached retina   . Fibromyalgia   . Hypertension   . Hypothyroidism   . Incontinence   . Macular degeneration   . Osteoarthritis    knees and back  . Pernicious anemia   . Stomach ulcer    in her 61s  . Thyroid disease   . UTI (lower urinary tract infection)   . Varicose veins   . Vitamin D deficiency     Past Surgical History:  Procedure Laterality Date  . ABDOMINAL HYSTERECTOMY  age 30  . APPENDECTOMY  age 48  . BLADDER SUSPENSION  age 64  . COLONOSCOPY    . ENDOVENOUS ABLATION SAPHENOUS VEIN W/ LASER Left 01-16-2016   endovenous laser ablation left small saphenous vein by Curt Jews MD  . laser surgery for retinal detachment Left 2016   Dr. Zigmund Daniel  . RENAL  ANGIOGRAM N/A 01/28/2012   Procedure: RENAL ANGIOGRAM;  Surgeon: Jettie Booze, MD;  Location[facility] CATH LAB;  Service: Cardiovascular;  Laterality: N/A;  . stab phlebectomy Right 12-08-2012   > 20 incisions by Curt Jews MD     Current Outpatient Medications  Medication Sig Dispense Refill  . Calcium Carbonate-Vitamin D (CALCIUM + D PO) Take 1 tablet by mouth 2 (two) times daily. Reported on 01/16/2016    . cephALEXin (KEFLEX) 250 MG capsule Take 250 mg by mouth every evening.    . cyanocobalamin (,VITAMIN B-12,) 1000 MCG/ML injection Inject 1,000 mcg into the muscle every 30 (thirty) days. Given in doctor's office     . diphenhydrAMINE (BENADRYL) 25 mg capsule Take 50 mg by mouth at bedtime.     Marland Kitchen estradiol (ESTRACE) 0.1 MG/GM vaginal cream Place 1 g vaginally once a week. Sundays    . ipratropium (ATROVENT) 0.03 % nasal spray Place 2-3 sprays into both nostrils daily.    . magnesium oxide (MAG-OX) 400 MG tablet Take 1-2 tablets by mouth daily.    . Multiple Vitamins-Minerals (CENTRUM SILVER ULTRA WOMENS) TABS Take 1 tablet by mouth every morning.     . Multiple Vitamins-Minerals (PRESERVISION/LUTEIN PO) Take  1 tablet by mouth 2 (two) times daily.    . sucralfate (CARAFATE) 1 g tablet     . SYNTHROID 100 MCG tablet     . valsartan (DIOVAN) 80 MG tablet Take 1 tablet (80 mg total) by mouth daily. 30 tablet 11   No current facility-administered medications for this visit.    Facility-Administered Medications Ordered in Other Visits  Medication Dose Route Frequency Provider Last Rate Last Dose  . diphenhydrAMINE (BENADRYL) injection 25 mg  25 mg Intravenous Once Jettie Booze, MD      . famotidine (PEPCID) 20 mg in sodium chloride 0.9 % 50 mL IVPB  20 mg Intravenous Once Jettie Booze, MD        Allergies:   Trazodone and nefazodone; Minoxidil; Codeine; Contrast media [iodinated diagnostic agents]; Cortisone; Dyclonine; Fesoterodine; Lisinopril; Spironolactone; Tequila  sunrise flavor; Toviaz [fesoterodine fumarate]; Astelin; Azelastine; Azithromycin; Cefuroxime axetil; Ciprofloxacin; Estradiol; Metrizamide; Ondansetron; Zithromax [azithromycin dihydrate]; and Zofran    Social History:  The patient  reports that she has never smoked. She has never used smokeless tobacco. She reports that she does not drink alcohol or use drugs.   Family History:  The patient's family history includes Bone cancer in her brother; Cancer in her mother; Congestive Heart Failure in her sister; Heart disease in her brother, brother, father, and sister; Melanoma in her mother; Pneumonia in her sister and sister; Rheum arthritis in her brother; Stroke in her mother.    ROS:  Please see the history of present illness.   Otherwise, review of systems are positive for fatigue.   All other systems are reviewed and negative.    PHYSICAL EXAM: VS:  BP (!) 164/74   Pulse 80   Ht 5' 2.5" (1.588 m)   Wt 156 lb (70.8 kg)   SpO2 94%   BMI 28.08 kg/m  , BMI Body mass index is 28.08 kg/m. GEN: Well nourished, well developed, in no acute distress  HEENT: normal  Neck: no JVD, carotid bruits, or masses Cardiac: RRR; no murmurs, rubs, or gallops,no edema  Respiratory:  clear to auscultation bilaterally, normal work of breathing GI: soft, nontender, nondistended, + BS MS: no deformity or atrophy  Skin: warm and dry, no rash Neuro:  Strength and sensation are intact Psych: euthymic mood, full affect   EKG:   The ekg ordered today demonstrates normal sinus rhythm, no ST segment changes   Recent Labs: 03/16/2018: BUN 28; Creatinine, Ser 0.95; Potassium 4.4; Sodium 140   Lipid Panel No results found for: CHOL, TRIG, HDL, CHOLHDL, VLDL, LDLCALC, LDLDIRECT   Other studies Reviewed: Additional studies/ records that were reviewed today with results demonstrating: Lab work reviewed.   ASSESSMENT AND PLAN:  1. Hypertension: She has some readings at home that are well controlled, in the  371 systolic.  Other readings are in the 160s.  I would like to avoid increasing the dose of her medicine to avoid possible hypotension.  We will change her valsartan to 80 mg twice a day instead of 160 mg daily. 2. Dyspnea on exertion/fatigue: She has a strong family history of coronary artery disease.  Multiple family members have died.  I am concerned this could be an anginal equivalent.  We will plan for coronary CTA to evaluate for coronary artery disease. 3. She did have some weakness when raising her arms to wash her hair.  This could be a symptom of polymyalgia rheumatica.  Would check an ESR when she comes back for her  blood work for the coronary CTA.  Would also have her follow-up with primary care regarding this.   Current medicines are reviewed at length with the patient today.  The patient concerns regarding her medicines were addressed.  The following changes have been made:  No change  Labs/ tests ordered today include:  No orders of the defined types were placed in this encounter.   Recommend 150 minutes/week of aerobic exercise Low fat, low carb, high fiber diet recommended  Disposition:   FU after CT scan   Signed, Larae Grooms, MD  09/16/2018 8:49 AM    [facility] [place], [place], [place]  [address] Phone: [phone]; Fax: [phone]

## 2018-09-16 NOTE — Patient Instructions (Addendum)
Medication Instructions:  Your physician has recommended you make the following change in your medication:   CHANGE: valsartan to 80 mg twice a day  If you need a refill on your cardiac medications before your next appointment, please call your pharmacy.   Lab work: Your physician recommends that you return for lab work Artist) prior to Cardiac CT   If you have labs (blood work) drawn today and your tests are completely normal, you will receive your results only by: Marland Kitchen MyChart Message (if you have MyChart) OR . A paper copy in the mail If you have any lab test that is abnormal or we need to change your treatment, we will call you to review the results.  Testing/Procedures: Your physician has requested that you have cardiac CT. Cardiac computed tomography (CT) is a painless test that uses an x-ray machine to take clear, detailed pictures of your heart. For further information please visit [facility]. Please follow instruction sheet as given.  Follow-Up: . Based on test results  Any Other Special Instructions Will Be Listed Below (If Applicable).  CARDIAC CT INSTRUCTIONS  Please arrive at the [facility] main entrance of [facility] at xx:xx AM (30-45 minutes prior to test start time)  [facility] [place], [place] [address] [phone]  Proceed to the [facility] Radiology Department (First Floor).  Please follow these instructions carefully (unless otherwise directed):   On the Night Before the Test: . Be sure to Drink plenty of water. . Do not consume any caffeinated/decaffeinated beverages or chocolate 12 hours prior to your test. . Do not take any antihistamines 12 hours prior to your test. . If the patient has contrast allergy: ? Patient will need a prescription for Prednisone and very clear instructions (as follows): 1. Prednisone 50 mg - take 13 hours prior to test 2. Take another Prednisone 50 mg 7 hours prior to  test 3. Take another Prednisone 50 mg 1 hour prior to test 4. Take Benadryl 50 mg 1 hour prior to test . Patient must complete all four doses of above prophylactic medications. . Patient will need a ride after test due to Benadryl.  On the Day of the Test: . Drink plenty of water. Do not drink any water within one hour of the test. . Do not eat any food 4 hours prior to the test. . You may take your regular medications prior to the test.  . Take metoprolol (Lopressor) two hours prior to test.       After the Test: . Drink plenty of water. . After receiving IV contrast, you may experience a mild flushed feeling. This is normal. . On occasion, you may experience a mild rash up to 24 hours after the test. This is not dangerous. If this occurs, you can take Benadryl 25 mg and increase your fluid intake. . If you experience trouble breathing, this can be serious. If it is severe call 911 IMMEDIATELY. If it is mild, please call our office.

## 2018-09-20 DIAGNOSIS — R413 Other amnesia: Secondary | ICD-10-CM | POA: Insufficient documentation

## 2018-09-20 NOTE — Progress Notes (Deleted)
PATIENT: Riki Altes DOB: 1940-08-20  REASON FOR VISIT: follow up HISTORY FROM: patient  No chief complaint on file.    HISTORY OF PRESENT ILLNESS: Today 09/20/18 Malaney Mcbean is a 78 y.o. female here today for follow up. An MRI and PET scan were ordered, however, Mrs Arshad was reluctant to have these tests performed.   HISTORY: (copied from Dr Cathren Laine last note on 03/16/2018) HPI:  Bibiana Gillean is a 78 y.o. female here as a referral from Dr. Harrington Challenger for memory problems.  She is accompanied by her granddaughter who also provides much information.  Past medical history hypertension, hypothyroidism, insomnia, osteopenia, vitamin B12 deficiency anemia due to intrinsic factor deficiency, memory difficulty, sleep disorder, atherosclerosis of renal artery, orthostatic lightheadedness.  She is on B12 injections.  She is quite worried about her memory over the last year and developing dementia.  She says in the shower she forgets what she cleaned. Loss of words and not recalling words. Started last year. Much worse this year. Started in the setting of stress. The stress resolved and the memory issues have still worsened. Granddaughter provides information, she forgets conversations, she repeats same things in the same day and she forgets recent events. More short-term memory but also fluency difficulty. She lives with her daughter. She takes care of her own bills, hasn't missed appointments. If someone calls she may forget. Not forgetting names or anniversaries or birthdays, she still drives and no accidents in the home or in the car. Also "loss of words", she has difficulty getting words out or can't get the word out or think of the word. She gets frustrated about it. No FHx of dementia. Mother was 23 without issue, father dies at 74. She sees an endocrinologist recently.No other focal neurologic deficits, associated symptoms, inciting events or modifiable factors.  REVIEW OF SYSTEMS: Out of a complete 14  system review of symptoms, the patient complains only of the following symptoms, and all other reviewed systems are negative.  ALLERGIES: Allergies  Allergen Reactions  . Trazodone And Nefazodone Other (See Comments)    Chest pain  . Minoxidil     Tinsley and tachycardia  . Codeine Nausea And Vomiting  . Contrast Media [Iodinated Diagnostic Agents]     Rash - given during an eye procedure  . Cortisone Other (See Comments) and Swelling    Body swelling. Has taken other steroids Body swelling. Has taken other steroids  . Dyclonine Other (See Comments)    Pt. States she experienced a seizure after taking this medication. Pt. States she experienced a seizure after taking this medication.  . Fesoterodine Other (See Comments)    Dehydration and confusion  . Lisinopril Cough and Other (See Comments)  . Spironolactone     Full body rash  . [employer]     unknown  . Toviaz [Fesoterodine Fumarate] Other (See Comments)    Dehydration and confusion  . Astelin Rash  . Azelastine Rash  . Azithromycin Rash  . Cefuroxime Axetil Rash  . Ciprofloxacin Rash  . Estradiol Rash  . Metrizamide Rash    Rash - given during an eye procedure  . Ondansetron Rash  . Zithromax [Azithromycin Dihydrate] Rash  . Zofran Rash    HOME MEDICATIONS: Outpatient Medications Prior to Visit  Medication Sig Dispense Refill  . Calcium Carbonate-Vitamin D (CALCIUM + D PO) Take 1 tablet by mouth 2 (two) times daily. Reported on 01/16/2016    . cephALEXin (KEFLEX) 250 MG capsule Take  250 mg by mouth every evening.    . cyanocobalamin (,VITAMIN B-12,) 1000 MCG/ML injection Inject 1,000 mcg into the muscle every 30 (thirty) days. Given in doctor's office     . diphenhydrAMINE (BENADRYL) 25 mg capsule Take 50 mg by mouth at bedtime.     Marland Kitchen estradiol (ESTRACE) 0.1 MG/GM vaginal cream Place 1 g vaginally once a week. Sundays    . ipratropium (ATROVENT) 0.03 % nasal spray Place 2-3 sprays into both nostrils daily.     . magnesium oxide (MAG-OX) 400 MG tablet Take 1-2 tablets by mouth daily.    . metoprolol tartrate (LOPRESSOR) 50 MG tablet Take 1 tablet 2 hours prior to Cardiac CT 1 tablet 0  . Multiple Vitamins-Minerals (CENTRUM SILVER ULTRA WOMENS) TABS Take 1 tablet by mouth every morning.     . Multiple Vitamins-Minerals (PRESERVISION/LUTEIN PO) Take 1 tablet by mouth 2 (two) times daily.    . predniSONE (DELTASONE) 50 MG tablet Take 1 tablet (50 mg total) by mouth 13 hours, 7 hours, and 1 hour prior to Cardiac CT 3 tablet 0  . sucralfate (CARAFATE) 1 g tablet     . SYNTHROID 100 MCG tablet     . valsartan (DIOVAN) 80 MG tablet Take 1 tablet (80 mg total) by mouth 2 (two) times daily. 180 tablet 3   Facility-Administered Medications Prior to Visit  Medication Dose Route Frequency Provider Last Rate Last Dose  . diphenhydrAMINE (BENADRYL) injection 25 mg  25 mg Intravenous Once Jettie Booze, MD      . famotidine (PEPCID) 20 mg in sodium chloride 0.9 % 50 mL IVPB  20 mg Intravenous Once Jettie Booze, MD        PAST MEDICAL HISTORY: Past Medical History:  Diagnosis Date  . Anemia   . Atherosclerosis of renal artery ([place])   . Detached retina   . Fibromyalgia   . Hypertension   . Hypothyroidism   . Incontinence   . Macular degeneration   . Osteoarthritis    knees and back  . Pernicious anemia   . Stomach ulcer    in her 76s  . Thyroid disease   . UTI (lower urinary tract infection)   . Varicose veins   . Vitamin D deficiency     PAST SURGICAL HISTORY: Past Surgical History:  Procedure Laterality Date  . ABDOMINAL HYSTERECTOMY  age 6  . APPENDECTOMY  age 14  . BLADDER SUSPENSION  age 89  . COLONOSCOPY    . ENDOVENOUS ABLATION SAPHENOUS VEIN W/ LASER Left 01-16-2016   endovenous laser ablation left small saphenous vein by Curt Jews MD  . laser surgery for retinal detachment Left 2016   Dr. Zigmund Daniel  . RENAL ANGIOGRAM N/A 01/28/2012   Procedure: RENAL ANGIOGRAM;   Surgeon: Jettie Booze, MD;  Location: [facility] CATH LAB;  Service: Cardiovascular;  Laterality: N/A;  . stab phlebectomy Right 12-08-2012   > 20 incisions by Curt Jews MD    FAMILY HISTORY: Family History  Problem Relation Age of Onset  . Cancer Mother        leukemia  . Stroke Mother   . Melanoma Mother   . Heart disease Father   . Heart disease Sister   . Congestive Heart Failure Sister   . Pneumonia Sister   . Bone cancer Brother   . Pneumonia Sister   . Rheum arthritis Brother   . Heart disease Brother   . Heart disease Brother  open heart surgery  . Dementia Neg Hx     SOCIAL HISTORY: Social History   Socioeconomic History  . Marital status: Divorced    Spouse name: Not on file  . Number of children: 2  . Years of education: 23  . Highest education level: Not on file  Occupational History  . Not on file  Social Needs  . Financial resource strain: Not on file  . Food insecurity:    Worry: Not on file    Inability: Not on file  . Transportation needs:    Medical: Not on file    Non-medical: Not on file  Tobacco Use  . Smoking status: Never Smoker  . Smokeless tobacco: Never Used  Substance and Sexual Activity  . Alcohol use: Never    Alcohol/week: 0.0 standard drinks    Frequency: Never  . Drug use: Never  . Sexual activity: Not on file  Lifestyle  . Physical activity:    Days per week: Not on file    Minutes per session: Not on file  . Stress: Not on file  Relationships  . Social connections:    Talks on phone: Not on file    Gets together: Not on file    Attends religious service: Not on file    Active member of club or organization: Not on file    Attends meetings of clubs or organizations: Not on file    Relationship status: Not on file  . Intimate partner violence:    Fear of current or ex partner: Not on file    Emotionally abused: Not on file    Physically abused: Not on file    Forced sexual activity: Not on file  Other Topics  Concern  . Not on file  Social History Narrative   She lives at home with her daughter. They share a town home.    No caffeine   Right handed      PHYSICAL EXAM  There were no vitals filed for this visit. There is no height or weight on file to calculate BMI.  Generalized: Well developed, in no acute distress  Cardiology: normal rate and rhythm, no murmur noted Neurological examination  Mentation: Alert oriented to time, place, history taking. Follows all commands speech and language fluent Cranial nerve II-XII: Pupils were equal round reactive to light. Extraocular movements were full, visual field were full on confrontational test. Facial sensation and strength were normal. Uvula tongue midline. Head turning and shoulder shrug  were normal and symmetric. Motor: The motor testing reveals 5 over 5 strength of all 4 extremities. Good symmetric motor tone is noted throughout.  Sensory: Sensory testing is intact to soft touch on all 4 extremities. No evidence of extinction is noted.  Coordination: Cerebellar testing reveals good finger-nose-finger and heel-to-shin bilaterally.  Gait and station: Gait is normal. Tandem gait is normal. Romberg is negative. No drift is seen.  Reflexes: Deep tendon reflexes are symmetric and normal bilaterally.   DIAGNOSTIC DATA (LABS, IMAGING, TESTING) - I reviewed patient records, labs, notes, testing and imaging myself where available.  MMSE - Mini Mental State Exam 03/16/2018  Orientation to time 5  Orientation to Place 5  Registration 3  Attention/ Calculation 5  Recall 3  Language- name 2 objects 2  Language- repeat 1  Language- follow 3 step command 3  Language- read & follow direction 1  Write a sentence 1  Copy design 0  Total score 29     No results  found for: WBC, HGB, HCT, MCV, PLT    Component Value Date/Time   NA 140 03/16/2018 1522   K 4.4 03/16/2018 1522   CL 101 03/16/2018 1522   CO2 24 03/16/2018 1522   GLUCOSE 81  03/16/2018 1522   GLUCOSE 82 02/21/2010 1835   BUN 28 (H) 03/16/2018 1522   CREATININE 0.95 03/16/2018 1522   CALCIUM 9.5 03/16/2018 1522   GFRNONAA 57 (L) 03/16/2018 1522   GFRAA 66 03/16/2018 1522   No results found for: CHOL, HDL, LDLCALC, LDLDIRECT, TRIG, CHOLHDL No results found for: HGBA1C No results found for: VITAMINB12 No results found for: TSH     ASSESSMENT AND PLAN 78 y.o. year old female  has a past medical history of Anemia, Atherosclerosis of renal artery ([place]), Detached retina, Fibromyalgia, Hypertension, Hypothyroidism, Incontinence, Macular degeneration, Osteoarthritis, Pernicious anemia, Stomach ulcer, Thyroid disease, UTI (lower urinary tract infection), Varicose veins, and Vitamin D deficiency. here with ***    ICD-10-CM   1. Memory loss R41.3        No orders of the defined types were placed in this encounter.    No orders of the defined types were placed in this encounter.     I spent 15 minutes with the patient. 50% of this time was spent counseling and educating patient on plan of care and medications.    Debbora Presto, FNP-C 09/20/2018, 3:53 PM [facility] [address], [place], [place] [address] [phone]

## 2018-09-27 ENCOUNTER — Ambulatory Visit: Payer: PPO | Admitting: Nurse Practitioner

## 2018-09-27 ENCOUNTER — Ambulatory Visit: Payer: PPO | Admitting: Family Medicine

## 2018-09-27 ENCOUNTER — Ambulatory Visit: Payer: PPO | Admitting: Neurology

## 2018-09-28 ENCOUNTER — Other Ambulatory Visit: Payer: PPO | Admitting: *Deleted

## 2018-09-28 ENCOUNTER — Telehealth: Payer: Self-pay | Admitting: Interventional Cardiology

## 2018-09-28 DIAGNOSIS — I10 Essential (primary) hypertension: Secondary | ICD-10-CM | POA: Diagnosis not present

## 2018-09-28 DIAGNOSIS — Z8249 Family history of ischemic heart disease and other diseases of the circulatory system: Secondary | ICD-10-CM | POA: Diagnosis not present

## 2018-09-28 DIAGNOSIS — Z01812 Encounter for preprocedural laboratory examination: Secondary | ICD-10-CM | POA: Diagnosis not present

## 2018-09-28 DIAGNOSIS — R531 Weakness: Secondary | ICD-10-CM

## 2018-09-28 DIAGNOSIS — R0600 Dyspnea, unspecified: Secondary | ICD-10-CM

## 2018-09-28 DIAGNOSIS — R0609 Other forms of dyspnea: Secondary | ICD-10-CM

## 2018-09-28 NOTE — Telephone Encounter (Signed)
° °  Patient calling to confirm with labs from Dr Lona Kettle PCP office have been received. The patient does not want to duplicate labs needed for CT

## 2018-09-28 NOTE — Telephone Encounter (Signed)
Late entry:  Called and left detailed message on patient's VM (DPR on file) letting her know that her lab appointment was needed prior to her Cardiac CT and I will call her with her results since she is coming to appointment. Instructed for patient to call back with any additional questions or concerns.

## 2018-09-28 NOTE — Telephone Encounter (Signed)
° °  Follow up   Patient called back, states no need to call her. She contacted her insurance regarding labs and will have labs done today

## 2018-09-29 LAB — BASIC METABOLIC PANEL
BUN/Creatinine Ratio: 21 (ref 12–28)
BUN: 22 mg/dL (ref 8–27)
CO2: 26 mmol/L (ref 20–29)
Calcium: 9.6 mg/dL (ref 8.7–10.3)
Chloride: 99 mmol/L (ref 96–106)
Creatinine, Ser: 1.04 mg/dL — ABNORMAL HIGH (ref 0.57–1.00)
GFR calc Af Amer: 59 mL/min/{1.73_m2} — ABNORMAL LOW (ref >59)
GFR calc non Af Amer: 51 mL/min/{1.73_m2} — ABNORMAL LOW (ref >59)
Glucose: 78 mg/dL (ref 65–99)
Potassium: 4.9 mmol/L (ref 3.5–5.2)
Sodium: 138 mmol/L (ref 134–144)

## 2018-10-03 ENCOUNTER — Telehealth (HOSPITAL_COMMUNITY): Payer: Self-pay | Admitting: Emergency Medicine

## 2018-10-03 NOTE — Telephone Encounter (Signed)
Reaching out to patient to offer assistance regarding upcoming cardiac imaging study; pt verbalizes understanding of appt date/time, parking situation and where to check in, pre-test NPO status and medications ordered, and verified current allergies; name and call back number provided for further questions should they arise Onofrio Klemp RN Navigator Cardiac Imaging [facility] [phone] office [phone] cell 

## 2018-10-04 ENCOUNTER — Ambulatory Visit (HOSPITAL_COMMUNITY): Payer: PPO

## 2018-10-04 DIAGNOSIS — D510 Vitamin B12 deficiency anemia due to intrinsic factor deficiency: Secondary | ICD-10-CM | POA: Diagnosis not present

## 2018-10-05 ENCOUNTER — Ambulatory Visit (HOSPITAL_COMMUNITY)
Admission: RE | Admit: 2018-10-05 | Discharge: 2018-10-05 | Disposition: A | Discharge status: Home / Self Care | Payer: PPO | Source: Ambulatory Visit | Attending: Interventional Cardiology | Admitting: Interventional Cardiology

## 2018-10-05 ENCOUNTER — Ambulatory Visit (HOSPITAL_COMMUNITY): Admission: RE | Admit: 2018-10-05 | Payer: PPO | Source: Ambulatory Visit

## 2018-10-05 ENCOUNTER — Telehealth: Payer: Self-pay | Admitting: Interventional Cardiology

## 2018-10-05 ENCOUNTER — Encounter (HOSPITAL_COMMUNITY): Payer: Self-pay

## 2018-10-05 DIAGNOSIS — R0600 Dyspnea, unspecified: Secondary | ICD-10-CM

## 2018-10-05 DIAGNOSIS — Z8249 Family history of ischemic heart disease and other diseases of the circulatory system: Secondary | ICD-10-CM | POA: Insufficient documentation

## 2018-10-05 DIAGNOSIS — R531 Weakness: Secondary | ICD-10-CM | POA: Insufficient documentation

## 2018-10-05 DIAGNOSIS — R0609 Other forms of dyspnea: Secondary | ICD-10-CM | POA: Insufficient documentation

## 2018-10-05 DIAGNOSIS — I10 Essential (primary) hypertension: Secondary | ICD-10-CM | POA: Diagnosis not present

## 2018-10-05 MED ORDER — IOPAMIDOL (ISOVUE-370) INJECTION 76%
80.0000 mL | Freq: Once | INTRAVENOUS | Status: AC | PRN
  Administered 2018-10-05: 80 mL via INTRAVENOUS

## 2018-10-05 MED ORDER — NITROGLYCERIN 0.4 MG SL SUBL
0.8000 mg | SUBLINGUAL_TABLET | Freq: Once | SUBLINGUAL | Status: AC
  Administered 2018-10-05: 0.8 mg via SUBLINGUAL
  Filled 2018-10-05: qty 25

## 2018-10-05 MED ORDER — NITROGLYCERIN 0.4 MG SL SUBL
SUBLINGUAL_TABLET | SUBLINGUAL | Status: AC
  Filled 2018-10-05: qty 2

## 2018-10-05 NOTE — Telephone Encounter (Signed)
° °  Pt c/o medication issue:  1. Name of Medication: valsartan (DIOVAN) 80 MG  2. How are you currently taking this medication (dosage and times per day)? 80 mg 2x daily (am and pm)  3. Are you having a reaction (difficulty breathing--STAT)? no  4. What is your medication issue? Pt took usual dose yesterday night. She wanted to make sure it is safe to take her AM dose, because she has a procedure at 3pm today and has been advised to take metoprolol tartrate (LOPRESSOR) 50 MG  At 1pm pre procedure

## 2018-10-05 NOTE — Telephone Encounter (Signed)
Returned call to patient. She states that her BP this morning is 142/80 HR 86. Patient wanting to know if she should take her valsartan 80 mg tablet this morning. She states that she is suppose to take metoprolol tartrate 50 mg tablet this afternoon 2 hours prior to her Cardiac CT. Made patient aware that she should take her valsartan this morning and her metoprolol this afternoon prior to her Cardiac CT. Instructed patient to check her BP prior to her PM dose of valsartan and instructed her to hold it if her SBP<110. Patient verbalized understanding and thanked me for the call.

## 2018-10-05 NOTE — Discharge Instructions (Signed)
What You Need to Know About IV Contrast Material °IV contrast material is most often a fluid that is used with some imaging tests. Contrast material is injected into your body through a vein to help your health care providers see your organs and tissues more clearly. It may be used with: °· X-ray. °· MRI. °· CT. °· Ultrasound. °Contrast material is used when your health care providers need a detailed look at organs, tissues, or blood vessels that may not show up with the standard test. IV contrast may be used for imaging tests that examine: °· Muscles, skin, and fat. °· Breasts. °· Brain. °· Digestive tract. °· Heart. °· Liver. °· Lungs and many other internal organs. °What are the risks of using IV contrast material? °The risks of using IV contrast material include: °· Headache. °· Itching, skin rash, and hives. °· Allergic reactions. °· Nausea and vomiting. °· Wheezing or difficulty breathing. °· Abnormal heart rate. °· Blood pressure changes. °· Throat swelling. °· Kidney damage. °These complications are more likely to occur in people who: °· Have kidney failure. °· Have liver problems. °· Have certain heart problems, including: °? Heart failure. °? Heart attack. °? Heart infection. °? Heart valve problems. °· Abuse alcohol. °· Have allergies or asthma. °· Are dehydrated. °· Have sickle cell anemia or similar problems. °· Have had trouble with IV contrast material in the past. °· Take certain medicines, such as: °? Metformin. °? NSAIDs. °? Beta blockers. °? Interleukin-2. °How do I prepare for my test with IV contrast material? °· Follow instructions from your health care provider about eating or drinking restrictions. °· Ask your health care provider about changing or stopping your regular medicines. This is especially important if you are taking diabetes medicines or blood thinners. °· Tell your health care provider about: °? Any previous illnesses, surgeries, or pre-existing medical conditions. °? Whether you  are pregnant or may be pregnant. °? Whether you are breastfeeding. Most contrast agents are safe for use in breastfeeding women. °· You may have a physical exam to determine any potential risks. °· Ask if you will be given a medicine (sedative) to help you relax during the procedure. If so, plan to have someone take you home after test. °What happens during the test with IV contrast material? ° °· You may be given a sedative to help you relax. °· A needle will be inserted into one of your veins to administer the IV contrast material. °· You may feel warmth or flushing as the material enters your bloodstream. °· You may have a metallic taste in your mouth for a few minutes. °· The needle may cause some discomfort and bruising. °· After the contrast material is in your body, the imaging test will be done. °The procedure may vary among health care providers and hospitals. °What happens after the test with IV contrast material? °· You may be asked to drink water or other fluids to wash (flush) the contrast material out of your body. °· Drink enough fluid to keep your urine pale yellow. °· Do not drive for 24 hours if you received a sedative. °· It is your responsibility to get your test results. Ask your health care provider or the department performing the test when your results will be ready. °Contact a health care provider if: °· You have redness, swelling, or pain near your IV site. °Get help right away if: °· You have an abnormal heart rhythm. °· You have trouble breathing. °· You have: °?   Chest pain. °? Pain in your back, neck, arm, jaw, or stomach. °? Nausea or sweating. °? Hives or a rash. °· You start shaking and cannot stop. °These symptoms may represent a serious problem that is an emergency. Do not wait to see if the symptoms will go away. Get medical help right away. Call your local emergency services (911 in the [place] Do not drive yourself to the hospital. °Summary °· IV contrast may be used for imaging  tests to help your health care providers see your organs and tissues more clearly. °· Tell your health care provider if you are pregnant or may be pregnant. °· During the procedure, you may feel warmth or flushing as the material enters your bloodstream. °· After the procedure, drink enough fluid to keep your urine pale yellow. °This information is not intended to replace advice given to you by your health care provider. Make sure you discuss any questions you have with your health care provider. °Document Released: 07/22/2009 Document Revised: 03/28/2018 Document Reviewed: 04/10/2015 °Elsevier Interactive Patient Education © 2019 [employer] ° °

## 2018-10-05 NOTE — Progress Notes (Signed)
Pt tolerated exam without incident.  PIV removed and bandage applied.  Pt denies any dizziness, lightheadedness.  VSS.  Pt given discharge instructions to drink plenty of fluids post-contrast administration

## 2018-10-07 ENCOUNTER — Telehealth: Payer: Self-pay | Admitting: Interventional Cardiology

## 2018-10-07 NOTE — Telephone Encounter (Signed)
Called and spoke to patient. Patient wanted to know what she should do about her feeling weak when she reaches up to shampoo her head. Instructed patient to follow up with PCP.

## 2018-10-07 NOTE — Telephone Encounter (Signed)
New message   Please contact patient in regards CT Cardiac Scan results. Patient has additional questions.

## 2018-10-18 DIAGNOSIS — E039 Hypothyroidism, unspecified: Secondary | ICD-10-CM | POA: Diagnosis not present

## 2018-10-18 DIAGNOSIS — R635 Abnormal weight gain: Secondary | ICD-10-CM | POA: Diagnosis not present

## 2018-10-18 DIAGNOSIS — R1013 Epigastric pain: Secondary | ICD-10-CM | POA: Diagnosis not present

## 2018-10-18 DIAGNOSIS — N183 Chronic kidney disease, stage 3 (moderate): Secondary | ICD-10-CM | POA: Diagnosis not present

## 2018-11-10 ENCOUNTER — Encounter (INDEPENDENT_AMBULATORY_CARE_PROVIDER_SITE_OTHER): Payer: PPO | Admitting: Ophthalmology

## 2018-11-14 ENCOUNTER — Encounter (INDEPENDENT_AMBULATORY_CARE_PROVIDER_SITE_OTHER): Payer: PPO | Admitting: Ophthalmology

## 2018-11-18 ENCOUNTER — Ambulatory Visit: Payer: PPO | Admitting: Interventional Cardiology

## 2018-11-23 DIAGNOSIS — D510 Vitamin B12 deficiency anemia due to intrinsic factor deficiency: Secondary | ICD-10-CM | POA: Diagnosis not present

## 2018-12-26 DIAGNOSIS — D510 Vitamin B12 deficiency anemia due to intrinsic factor deficiency: Secondary | ICD-10-CM | POA: Diagnosis not present

## 2019-01-13 ENCOUNTER — Other Ambulatory Visit: Payer: Self-pay

## 2019-01-13 ENCOUNTER — Encounter (INDEPENDENT_AMBULATORY_CARE_PROVIDER_SITE_OTHER): Payer: PPO | Admitting: Ophthalmology

## 2019-01-13 DIAGNOSIS — H43813 Vitreous degeneration, bilateral: Secondary | ICD-10-CM | POA: Diagnosis not present

## 2019-01-13 DIAGNOSIS — I10 Essential (primary) hypertension: Secondary | ICD-10-CM | POA: Diagnosis not present

## 2019-01-13 DIAGNOSIS — H26493 Other secondary cataract, bilateral: Secondary | ICD-10-CM

## 2019-01-13 DIAGNOSIS — H33302 Unspecified retinal break, left eye: Secondary | ICD-10-CM | POA: Diagnosis not present

## 2019-01-13 DIAGNOSIS — H35033 Hypertensive retinopathy, bilateral: Secondary | ICD-10-CM | POA: Diagnosis not present

## 2019-01-13 DIAGNOSIS — H353132 Nonexudative age-related macular degeneration, bilateral, intermediate dry stage: Secondary | ICD-10-CM

## 2019-01-20 DIAGNOSIS — E039 Hypothyroidism, unspecified: Secondary | ICD-10-CM | POA: Diagnosis not present

## 2019-01-20 DIAGNOSIS — I10 Essential (primary) hypertension: Secondary | ICD-10-CM | POA: Diagnosis not present

## 2019-01-20 DIAGNOSIS — I701 Atherosclerosis of renal artery: Secondary | ICD-10-CM | POA: Diagnosis not present

## 2019-01-20 DIAGNOSIS — R635 Abnormal weight gain: Secondary | ICD-10-CM | POA: Diagnosis not present

## 2019-01-20 DIAGNOSIS — D510 Vitamin B12 deficiency anemia due to intrinsic factor deficiency: Secondary | ICD-10-CM | POA: Diagnosis not present

## 2019-01-20 DIAGNOSIS — Z0000 Encounter for general adult medical examination without abnormal findings: Secondary | ICD-10-CM | POA: Diagnosis not present

## 2019-01-20 DIAGNOSIS — E559 Vitamin D deficiency, unspecified: Secondary | ICD-10-CM | POA: Diagnosis not present

## 2019-01-20 DIAGNOSIS — N183 Chronic kidney disease, stage 3 (moderate): Secondary | ICD-10-CM | POA: Diagnosis not present

## 2019-01-20 DIAGNOSIS — M8580 Other specified disorders of bone density and structure, unspecified site: Secondary | ICD-10-CM | POA: Diagnosis not present

## 2019-01-20 DIAGNOSIS — R1013 Epigastric pain: Secondary | ICD-10-CM | POA: Diagnosis not present

## 2019-02-03 ENCOUNTER — Encounter (INDEPENDENT_AMBULATORY_CARE_PROVIDER_SITE_OTHER): Payer: PPO | Admitting: Ophthalmology

## 2019-02-03 ENCOUNTER — Other Ambulatory Visit: Payer: Self-pay

## 2019-02-03 DIAGNOSIS — H2702 Aphakia, left eye: Secondary | ICD-10-CM | POA: Diagnosis not present

## 2019-02-03 DIAGNOSIS — H353132 Nonexudative age-related macular degeneration, bilateral, intermediate dry stage: Secondary | ICD-10-CM | POA: Diagnosis not present

## 2019-02-07 DIAGNOSIS — H401131 Primary open-angle glaucoma, bilateral, mild stage: Secondary | ICD-10-CM | POA: Diagnosis not present

## 2019-02-20 DIAGNOSIS — D510 Vitamin B12 deficiency anemia due to intrinsic factor deficiency: Secondary | ICD-10-CM | POA: Diagnosis not present

## 2019-03-03 DIAGNOSIS — Z1231 Encounter for screening mammogram for malignant neoplasm of breast: Secondary | ICD-10-CM | POA: Diagnosis not present

## 2019-03-03 DIAGNOSIS — Z803 Family history of malignant neoplasm of breast: Secondary | ICD-10-CM | POA: Diagnosis not present

## 2019-03-21 DIAGNOSIS — D510 Vitamin B12 deficiency anemia due to intrinsic factor deficiency: Secondary | ICD-10-CM | POA: Diagnosis not present

## 2019-03-27 DIAGNOSIS — H353131 Nonexudative age-related macular degeneration, bilateral, early dry stage: Secondary | ICD-10-CM | POA: Diagnosis not present

## 2019-03-27 DIAGNOSIS — Z961 Presence of intraocular lens: Secondary | ICD-10-CM | POA: Diagnosis not present

## 2019-03-27 DIAGNOSIS — H40013 Open angle with borderline findings, low risk, bilateral: Secondary | ICD-10-CM | POA: Diagnosis not present

## 2019-04-07 DIAGNOSIS — N3020 Other chronic cystitis without hematuria: Secondary | ICD-10-CM | POA: Diagnosis not present

## 2019-04-07 DIAGNOSIS — R350 Frequency of micturition: Secondary | ICD-10-CM | POA: Diagnosis not present

## 2019-04-19 DIAGNOSIS — Z23 Encounter for immunization: Secondary | ICD-10-CM | POA: Diagnosis not present

## 2019-04-19 DIAGNOSIS — D510 Vitamin B12 deficiency anemia due to intrinsic factor deficiency: Secondary | ICD-10-CM | POA: Diagnosis not present

## 2019-05-15 DIAGNOSIS — D510 Vitamin B12 deficiency anemia due to intrinsic factor deficiency: Secondary | ICD-10-CM | POA: Diagnosis not present

## 2019-05-30 DIAGNOSIS — H33002 Unspecified retinal detachment with retinal break, left eye: Secondary | ICD-10-CM | POA: Diagnosis not present

## 2019-05-30 DIAGNOSIS — Z961 Presence of intraocular lens: Secondary | ICD-10-CM | POA: Diagnosis not present

## 2019-05-30 DIAGNOSIS — H40053 Ocular hypertension, bilateral: Secondary | ICD-10-CM | POA: Diagnosis not present

## 2019-05-30 DIAGNOSIS — H353131 Nonexudative age-related macular degeneration, bilateral, early dry stage: Secondary | ICD-10-CM | POA: Diagnosis not present

## 2019-06-13 DIAGNOSIS — D510 Vitamin B12 deficiency anemia due to intrinsic factor deficiency: Secondary | ICD-10-CM | POA: Diagnosis not present

## 2019-06-22 DIAGNOSIS — D510 Vitamin B12 deficiency anemia due to intrinsic factor deficiency: Secondary | ICD-10-CM | POA: Diagnosis not present

## 2019-06-22 DIAGNOSIS — E559 Vitamin D deficiency, unspecified: Secondary | ICD-10-CM | POA: Diagnosis not present

## 2019-06-22 DIAGNOSIS — I701 Atherosclerosis of renal artery: Secondary | ICD-10-CM | POA: Diagnosis not present

## 2019-06-22 DIAGNOSIS — E039 Hypothyroidism, unspecified: Secondary | ICD-10-CM | POA: Diagnosis not present

## 2019-06-22 DIAGNOSIS — N183 Chronic kidney disease, stage 3 unspecified: Secondary | ICD-10-CM | POA: Diagnosis not present

## 2019-06-22 DIAGNOSIS — I10 Essential (primary) hypertension: Secondary | ICD-10-CM | POA: Diagnosis not present

## 2019-06-26 DIAGNOSIS — L821 Other seborrheic keratosis: Secondary | ICD-10-CM | POA: Diagnosis not present

## 2019-06-26 DIAGNOSIS — Z808 Family history of malignant neoplasm of other organs or systems: Secondary | ICD-10-CM | POA: Diagnosis not present

## 2019-06-26 DIAGNOSIS — Z23 Encounter for immunization: Secondary | ICD-10-CM | POA: Diagnosis not present

## 2019-06-26 DIAGNOSIS — D225 Melanocytic nevi of trunk: Secondary | ICD-10-CM | POA: Diagnosis not present

## 2019-06-26 DIAGNOSIS — Z85828 Personal history of other malignant neoplasm of skin: Secondary | ICD-10-CM | POA: Diagnosis not present

## 2019-06-26 DIAGNOSIS — L820 Inflamed seborrheic keratosis: Secondary | ICD-10-CM | POA: Diagnosis not present

## 2019-06-26 DIAGNOSIS — L814 Other melanin hyperpigmentation: Secondary | ICD-10-CM | POA: Diagnosis not present

## 2019-07-11 DIAGNOSIS — H33002 Unspecified retinal detachment with retinal break, left eye: Secondary | ICD-10-CM | POA: Diagnosis not present

## 2019-07-11 DIAGNOSIS — Z961 Presence of intraocular lens: Secondary | ICD-10-CM | POA: Diagnosis not present

## 2019-07-11 DIAGNOSIS — H353131 Nonexudative age-related macular degeneration, bilateral, early dry stage: Secondary | ICD-10-CM | POA: Diagnosis not present

## 2019-07-11 DIAGNOSIS — H40053 Ocular hypertension, bilateral: Secondary | ICD-10-CM | POA: Diagnosis not present

## 2019-07-11 IMAGING — CT CT HEART MORP W/ CTA COR W/ SCORE W/ CA W/CM &/OR W/O CM
2 of 3 series · 14 of 20 positions shown, 16 images · non-contrast
Comparison: None.

Addendum:
EXAM:
OVER-READ INTERPRETATION  CT CHEST

The following report is an over-read performed by radiologist Dr.
Lusine Jim [REDACTED] on 10/05/2018. This
over-read does not include interpretation of cardiac or coronary
anatomy or pathology. The coronary CTA interpretation by the
cardiologist is attached.
CLINICAL DATA: Chest pain
Cardiac CTA
MEDICATIONS:
Sub lingual nitro. 4mg x 2
TECHNIQUE: The patient was scanned on a Siemens [REDACTED]ice scanner. Gantry
rotation speed was 250 msecs. Collimation was 0.6 mm. A 100 kV
prospective scan was triggered in the ascending thoracic aorta at
35-75% of the R-R interval. Average HR during the scan was 60 bpm.
The 3D data set was interpreted on a dedicated work station using
MPR, MIP and VRT modes. A total of 80cc of contrast was used.

[Series 6: best diast 75 % · axial · 0.39mm/px · z∈[+1142,+1242]mm · 7 of 338 slices shown, 9 images]
[im 43/338  vessel]
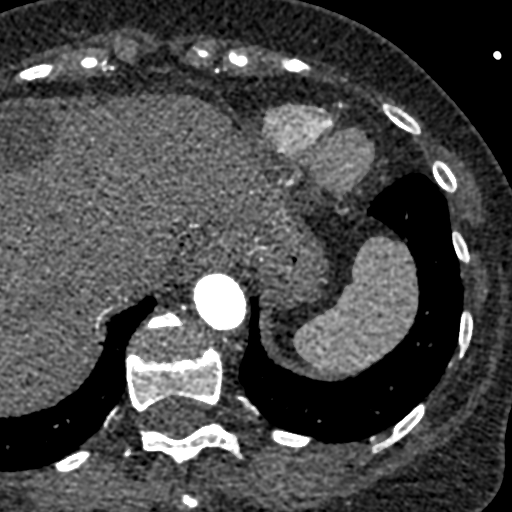
[im 43/338  lung]
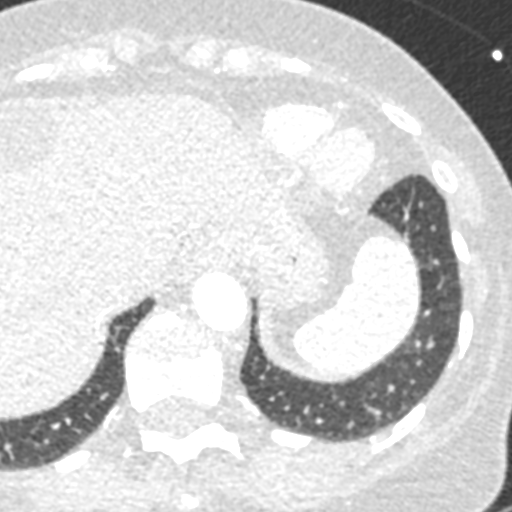
[im 85/338  vessel]
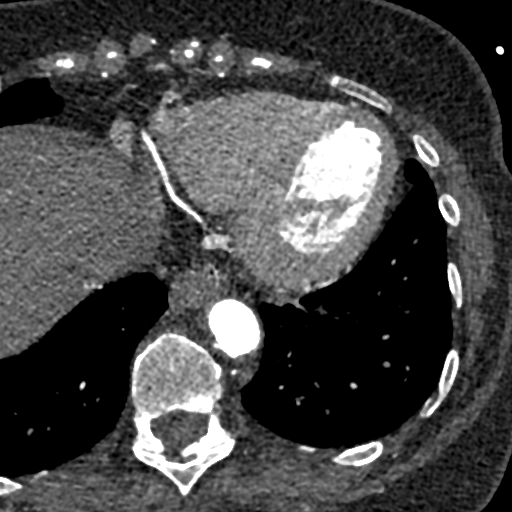
[im 127/338  vessel]
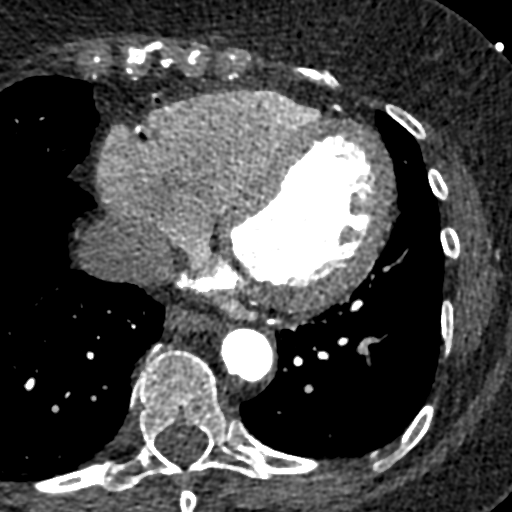
[im 169/338  vessel]
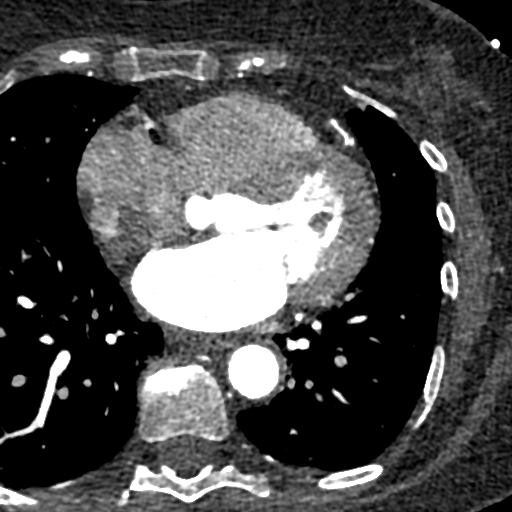
[im 211/338  vessel]
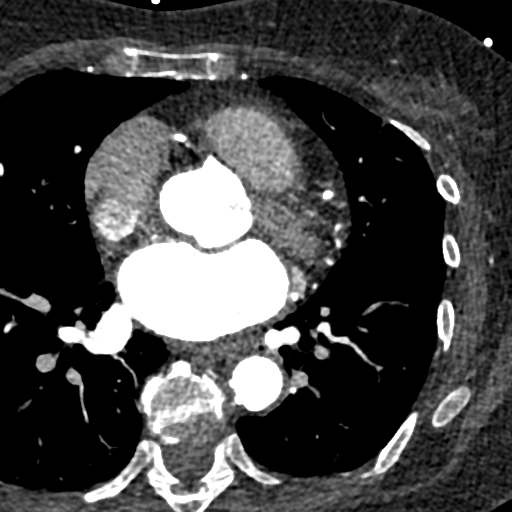
[im 211/338  lung]
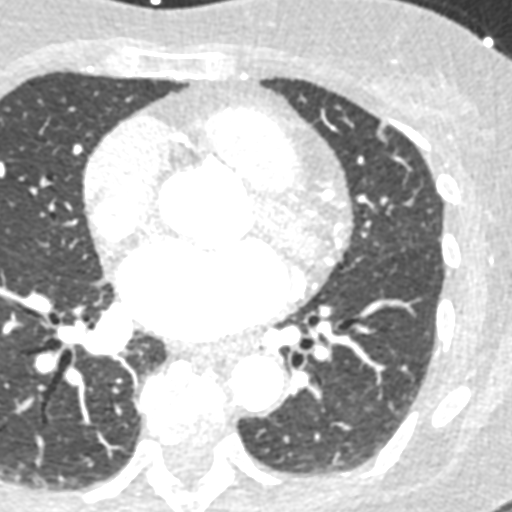
[im 253/338  vessel]
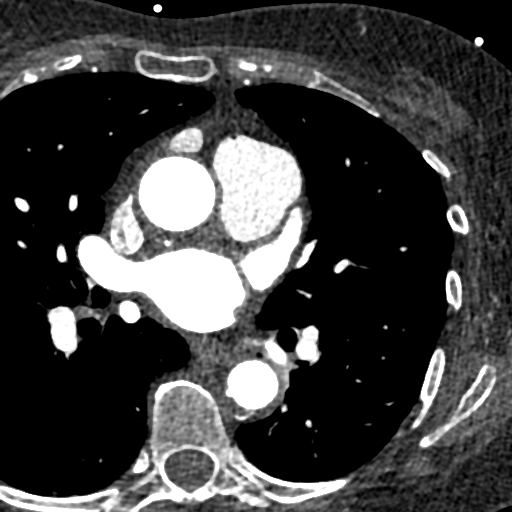
[im 295/338  vessel]
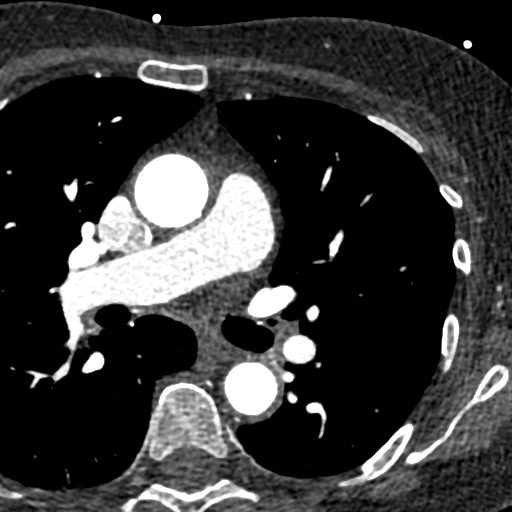

[Series 7: best syst 46 % · axial · 0.39mm/px · z∈[+1142,+1242]mm · 7 of 338 slices shown]
[im 43/338  vessel]
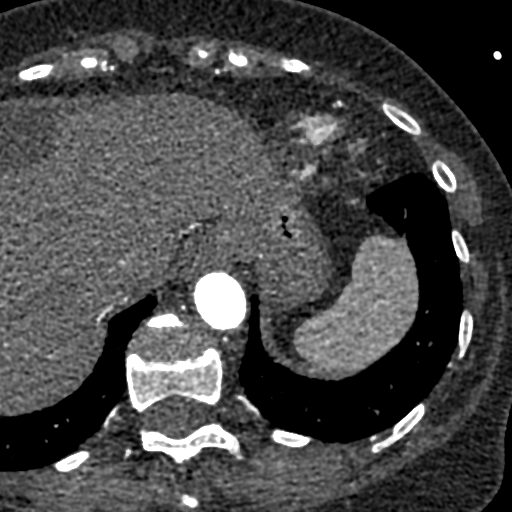
[im 85/338  vessel]
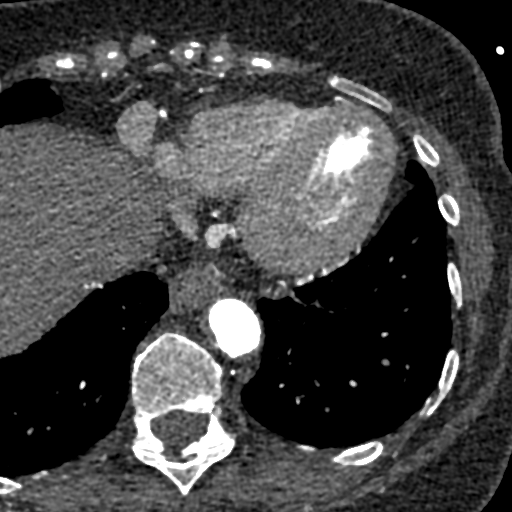
[im 127/338  vessel]
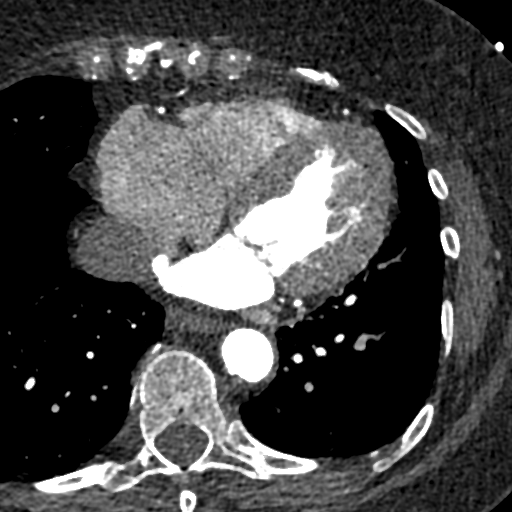
[im 169/338  vessel]
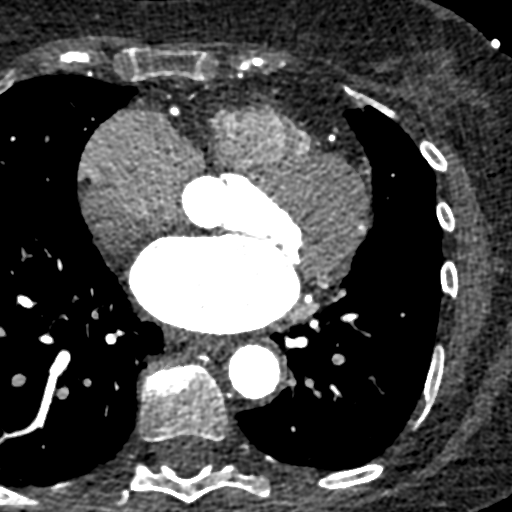
[im 211/338  vessel]
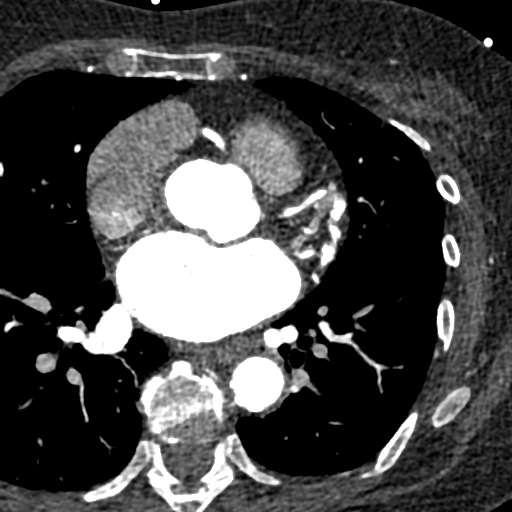
[im 253/338  vessel]
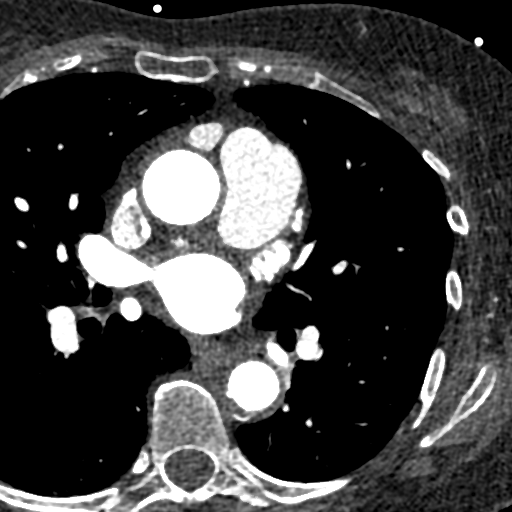
[im 295/338  vessel]
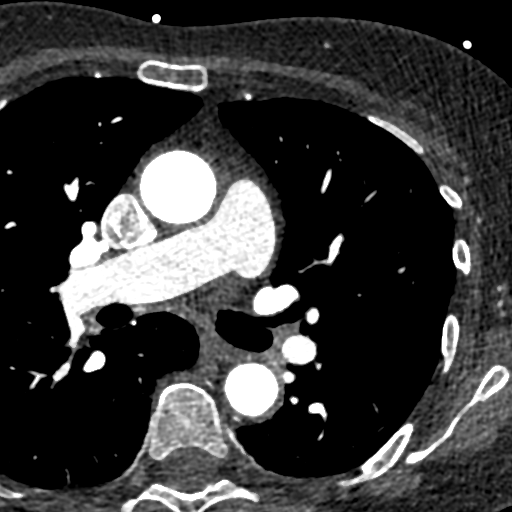

[14 of 20 positions shown; findings below may reference images not displayed]

FINDINGS: Vascular: No incidental findings.

Mediastinum/Nodes: Nonenlarged lymph node anterior to the right
mainstem bronchus measures 0.7 cm in short axis. Other visualized
segments of the mediastinum and hilar regions show no evidence of
lymphadenopathy.

Lungs/Pleura: Mild scarring at both lung bases. Visualized lungs
show no evidence of pulmonary edema, consolidation, pneumothorax,
nodule or pleural fluid.

Upper Abdomen: Oval cyst within the subcapsular anterior right lobe
of the liver measures approximately 2.5 x 3.3 cm and has a benign
appearance. There is a small hiatal hernia.

Musculoskeletal: No chest wall mass or suspicious bone lesions
identified.
IMPRESSION: 1. 3 cm cyst of the upper liver has a benign appearance.
2. Small hiatal hernia.
3. Nonenlarged small mediastinal lymph node anterior to the right
mainstem bronchus.
FINDINGS: Non-cardiac: See separate report from [REDACTED].

Pulmonary veins drain normally to the left atrium.

Calcium Score: 9.5 Agatston units.

Coronary Arteries: Right dominant with no anomalies

LM: No plaque or stenosis.

LAD system: Images affected by motion artifact, but using multiple
phases, there does not appear to be significant disease. There is a
small area of calcified plaque in the proximal LAD without
significant stenosis.

Circumflex system: Images affected by motion artifact, but using
multiple phases, there does not appear to be significant disease.
There is a small area of calcified plaque in the proximal LCx
without significant stenosis.

RCA system: No plaque or stenosis.
IMPRESSION: 1. Coronary artery calcium score 9.5 Agatston units. This places the
patient in the 21st percentile for age and gender, suggesting low
risk for future cardiac events.

2. Difficult study with motion artifact, but suspect no significant
disease.

Amnon Tiger

*** End of Addendum ***

## 2019-07-12 DIAGNOSIS — R519 Headache, unspecified: Secondary | ICD-10-CM | POA: Diagnosis not present

## 2019-07-12 DIAGNOSIS — D510 Vitamin B12 deficiency anemia due to intrinsic factor deficiency: Secondary | ICD-10-CM | POA: Diagnosis not present

## 2019-07-12 DIAGNOSIS — M316 Other giant cell arteritis: Secondary | ICD-10-CM | POA: Diagnosis not present

## 2019-08-07 ENCOUNTER — Telehealth: Payer: Self-pay | Admitting: *Deleted

## 2019-08-07 NOTE — Telephone Encounter (Signed)
Patient called requesting an appt. With Dr. Donnetta Hutching. She states this is not varicose vein and is having leg pain that increases with ambulation. Last seen in [facility] 2017. I asked her to contact her PCP right away for evaluation of problem first and if they believe she need surgical intervention to have them contact this office. Instructed to go to [facility] ER for any acute/emergency condition. She states she has a 08/21/2019 appt with her PCP.

## 2019-08-21 DIAGNOSIS — Z961 Presence of intraocular lens: Secondary | ICD-10-CM | POA: Diagnosis not present

## 2019-08-21 DIAGNOSIS — H353131 Nonexudative age-related macular degeneration, bilateral, early dry stage: Secondary | ICD-10-CM | POA: Diagnosis not present

## 2019-08-21 DIAGNOSIS — H43813 Vitreous degeneration, bilateral: Secondary | ICD-10-CM | POA: Diagnosis not present

## 2019-08-21 DIAGNOSIS — D510 Vitamin B12 deficiency anemia due to intrinsic factor deficiency: Secondary | ICD-10-CM | POA: Diagnosis not present

## 2019-08-21 DIAGNOSIS — H33002 Unspecified retinal detachment with retinal break, left eye: Secondary | ICD-10-CM | POA: Diagnosis not present

## 2019-08-21 DIAGNOSIS — I10 Essential (primary) hypertension: Secondary | ICD-10-CM | POA: Diagnosis not present

## 2019-08-21 DIAGNOSIS — M8580 Other specified disorders of bone density and structure, unspecified site: Secondary | ICD-10-CM | POA: Diagnosis not present

## 2019-08-21 DIAGNOSIS — M79606 Pain in leg, unspecified: Secondary | ICD-10-CM | POA: Diagnosis not present

## 2019-08-21 DIAGNOSIS — E559 Vitamin D deficiency, unspecified: Secondary | ICD-10-CM | POA: Diagnosis not present

## 2019-08-21 DIAGNOSIS — Z0000 Encounter for general adult medical examination without abnormal findings: Secondary | ICD-10-CM | POA: Diagnosis not present

## 2019-08-21 DIAGNOSIS — G43B0 Ophthalmoplegic migraine, not intractable: Secondary | ICD-10-CM | POA: Diagnosis not present

## 2019-08-21 DIAGNOSIS — E039 Hypothyroidism, unspecified: Secondary | ICD-10-CM | POA: Diagnosis not present

## 2019-08-21 DIAGNOSIS — H40053 Ocular hypertension, bilateral: Secondary | ICD-10-CM | POA: Diagnosis not present

## 2019-09-15 DIAGNOSIS — R5383 Other fatigue: Secondary | ICD-10-CM | POA: Diagnosis not present

## 2019-09-15 DIAGNOSIS — R110 Nausea: Secondary | ICD-10-CM | POA: Diagnosis not present

## 2019-09-15 DIAGNOSIS — R52 Pain, unspecified: Secondary | ICD-10-CM | POA: Diagnosis not present

## 2019-09-18 DIAGNOSIS — R52 Pain, unspecified: Secondary | ICD-10-CM | POA: Diagnosis not present

## 2019-09-21 DIAGNOSIS — M791 Myalgia, unspecified site: Secondary | ICD-10-CM | POA: Diagnosis not present

## 2019-09-21 DIAGNOSIS — I10 Essential (primary) hypertension: Secondary | ICD-10-CM | POA: Diagnosis not present

## 2019-09-21 DIAGNOSIS — E039 Hypothyroidism, unspecified: Secondary | ICD-10-CM | POA: Diagnosis not present

## 2019-09-21 DIAGNOSIS — M2550 Pain in unspecified joint: Secondary | ICD-10-CM | POA: Diagnosis not present

## 2019-09-21 DIAGNOSIS — E559 Vitamin D deficiency, unspecified: Secondary | ICD-10-CM | POA: Diagnosis not present

## 2019-09-27 DIAGNOSIS — G43B0 Ophthalmoplegic migraine, not intractable: Secondary | ICD-10-CM | POA: Diagnosis not present

## 2019-09-27 DIAGNOSIS — H353131 Nonexudative age-related macular degeneration, bilateral, early dry stage: Secondary | ICD-10-CM | POA: Diagnosis not present

## 2019-09-27 DIAGNOSIS — H43813 Vitreous degeneration, bilateral: Secondary | ICD-10-CM | POA: Diagnosis not present

## 2019-09-27 DIAGNOSIS — H40053 Ocular hypertension, bilateral: Secondary | ICD-10-CM | POA: Diagnosis not present

## 2019-09-28 DIAGNOSIS — G4700 Insomnia, unspecified: Secondary | ICD-10-CM | POA: Diagnosis not present

## 2019-09-28 DIAGNOSIS — R700 Elevated erythrocyte sedimentation rate: Secondary | ICD-10-CM | POA: Diagnosis not present

## 2019-09-28 DIAGNOSIS — E878 Other disorders of electrolyte and fluid balance, not elsewhere classified: Secondary | ICD-10-CM | POA: Diagnosis not present

## 2019-09-28 DIAGNOSIS — I10 Essential (primary) hypertension: Secondary | ICD-10-CM | POA: Diagnosis not present

## 2019-09-28 DIAGNOSIS — M353 Polymyalgia rheumatica: Secondary | ICD-10-CM | POA: Diagnosis not present

## 2019-10-04 ENCOUNTER — Other Ambulatory Visit: Payer: Self-pay | Admitting: Interventional Cardiology

## 2019-10-04 DIAGNOSIS — R0609 Other forms of dyspnea: Secondary | ICD-10-CM

## 2019-10-04 DIAGNOSIS — R0600 Dyspnea, unspecified: Secondary | ICD-10-CM

## 2019-10-04 DIAGNOSIS — R531 Weakness: Secondary | ICD-10-CM

## 2019-10-04 DIAGNOSIS — Z8249 Family history of ischemic heart disease and other diseases of the circulatory system: Secondary | ICD-10-CM

## 2019-10-04 DIAGNOSIS — I10 Essential (primary) hypertension: Secondary | ICD-10-CM

## 2019-10-05 ENCOUNTER — Other Ambulatory Visit: Payer: Self-pay

## 2019-10-05 ENCOUNTER — Emergency Department (HOSPITAL_COMMUNITY): Payer: PPO

## 2019-10-05 ENCOUNTER — Emergency Department (HOSPITAL_COMMUNITY)
Admission: EM | Admit: 2019-10-05 | Discharge: 2019-10-05 | Disposition: A | Discharge status: Home / Self Care | Payer: PPO | Attending: Emergency Medicine | Admitting: Emergency Medicine

## 2019-10-05 ENCOUNTER — Encounter (HOSPITAL_COMMUNITY): Payer: Self-pay

## 2019-10-05 DIAGNOSIS — S92325A Nondisplaced fracture of second metatarsal bone, left foot, initial encounter for closed fracture: Secondary | ICD-10-CM

## 2019-10-05 DIAGNOSIS — X58XXXA Exposure to other specified factors, initial encounter: Secondary | ICD-10-CM | POA: Insufficient documentation

## 2019-10-05 DIAGNOSIS — R4781 Slurred speech: Secondary | ICD-10-CM | POA: Diagnosis not present

## 2019-10-05 DIAGNOSIS — Y92000 Kitchen of unspecified non-institutional (private) residence as  the place of occurrence of the external cause: Secondary | ICD-10-CM | POA: Diagnosis not present

## 2019-10-05 DIAGNOSIS — Y939 Activity, unspecified: Secondary | ICD-10-CM | POA: Insufficient documentation

## 2019-10-05 DIAGNOSIS — Y999 Unspecified external cause status: Secondary | ICD-10-CM | POA: Insufficient documentation

## 2019-10-05 DIAGNOSIS — I10 Essential (primary) hypertension: Secondary | ICD-10-CM | POA: Diagnosis not present

## 2019-10-05 DIAGNOSIS — R55 Syncope and collapse: Secondary | ICD-10-CM

## 2019-10-05 DIAGNOSIS — J0100 Acute maxillary sinusitis, unspecified: Secondary | ICD-10-CM

## 2019-10-05 LAB — CBC WITH DIFFERENTIAL/PLATELET
Abs Immature Granulocytes: 0.14 10*3/uL — ABNORMAL HIGH (ref 0.00–0.07)
Basophils Absolute: 0 10*3/uL (ref 0.0–0.1)
Basophils Relative: 0 %
Eosinophils Absolute: 0 10*3/uL (ref 0.0–0.5)
Eosinophils Relative: 0 %
HCT: 37.7 % (ref 36.0–46.0)
Hemoglobin: 11.6 g/dL — ABNORMAL LOW (ref 12.0–15.0)
Immature Granulocytes: 1 %
Lymphocytes Relative: 24 %
Lymphs Abs: 3.2 10*3/uL (ref 0.7–4.0)
MCH: 28.8 pg (ref 26.0–34.0)
MCHC: 30.8 g/dL (ref 30.0–36.0)
MCV: 93.5 fL (ref 80.0–100.0)
Monocytes Absolute: 1.1 10*3/uL — ABNORMAL HIGH (ref 0.1–1.0)
Monocytes Relative: 8 %
Neutro Abs: 8.6 10*3/uL — ABNORMAL HIGH (ref 1.7–7.7)
Neutrophils Relative %: 67 %
Platelets: 333 10*3/uL (ref 150–400)
RBC: 4.03 MIL/uL (ref 3.87–5.11)
RDW: 14 % (ref 11.5–15.5)
WBC: 13.1 10*3/uL — ABNORMAL HIGH (ref 4.0–10.5)
nRBC: 0 % (ref 0.0–0.2)

## 2019-10-05 LAB — COMPREHENSIVE METABOLIC PANEL
ALT: 16 U/L (ref 0–44)
AST: 17 U/L (ref 15–41)
Albumin: 3.3 g/dL — ABNORMAL LOW (ref 3.5–5.0)
Alkaline Phosphatase: 53 U/L (ref 38–126)
Anion gap: 9 (ref 5–15)
BUN: 30 mg/dL — ABNORMAL HIGH (ref 8–23)
CO2: 26 mmol/L (ref 22–32)
Calcium: 8.8 mg/dL — ABNORMAL LOW (ref 8.9–10.3)
Chloride: 103 mmol/L (ref 98–111)
Creatinine, Ser: 1.26 mg/dL — ABNORMAL HIGH (ref 0.44–1.00)
GFR calc Af Amer: 47 mL/min — ABNORMAL LOW (ref >60)
GFR calc non Af Amer: 40 mL/min — ABNORMAL LOW (ref >60)
Glucose, Bld: 137 mg/dL — ABNORMAL HIGH (ref 70–99)
Potassium: 3.9 mmol/L (ref 3.5–5.1)
Sodium: 138 mmol/L (ref 135–145)
Total Bilirubin: 0.6 mg/dL (ref 0.3–1.2)
Total Protein: 6 g/dL — ABNORMAL LOW (ref 6.5–8.1)

## 2019-10-05 LAB — CBG MONITORING, ED: Glucose-Capillary: 136 mg/dL — ABNORMAL HIGH (ref 70–99)

## 2019-10-05 MED ORDER — FLUTICASONE PROPIONATE 50 MCG/ACT NA SUSP: 1.0000 | Freq: Two times a day (BID) | NASAL | 0 refills | Status: DC

## 2019-10-05 MED ORDER — LORAZEPAM 2 MG/ML IJ SOLN
0.5000 mg | Freq: Once | INTRAMUSCULAR | Status: AC
  Administered 2019-10-05: 0.5 mg via INTRAVENOUS
  Filled 2019-10-05: qty 1

## 2019-10-05 NOTE — ED Provider Notes (Signed)
4:20 PM-checkout from Dr. Maryan Rued to evaluate patient after MRI imaging.  She had a syncopal event earlier today, falling and injuring her foot, which is a small fracture.  6:40 PM-MRI return.  Patient is alert and comfortable.  Patient describes having some maxillary sinus pressure, recently.  She is chronically on ipratropium nasal spray.  Findings discussed with the patient.  Blood pressure 155/72.  Also discussed the findings with the patient's daughter, Maudie Mercury.  She will come and pick her mother up.  Medical decision making-syncope, without significant injury.  Screening for stroke is negative.  Incidental "acute sinusitis," per radiology interpretation of MRI of head.  Suspect chronic sinus disease, somewhat worse, requiring additional treatment with steroid nasal spray.  Patient is currently taking prednisone orally, but unlikely to have significant localizing effect, without addition of nasal steroid.  Doubt bacterial sinusitis at this time.  Doubt CVA, doubt intracranial injury from fall.  Suspect fall was ago related, possibly complicated by mild dehydration.  Note that her BUN and creatinine are somewhat elevated from baseline.  Patient is nontoxic and be managed in the outpatient setting.  Nursing Notes Reviewed/ Care Coordinated Applicable Imaging Reviewed Interpretation of Laboratory Data incorporated into ED treatment  The patient appears reasonably screened and/or stabilized for discharge and I doubt any other medical condition or other [facility] requiring further screening, evaluation, or treatment in the ED at this time prior to discharge.  Plan: Home Medications-continue usual, add Flonase nasal spray for 1 week; Home Treatments-rest, increase oral fluids, use regular diet; return here if the recommended treatment, does not improve the symptoms; Recommended follow up-PCP checkup 1 week and as needed.    Daleen Bo, MD 10/05/19 [id number]

## 2019-10-05 NOTE — ED Provider Notes (Signed)
[place] EMERGENCY DEPARTMENT Provider Note   CSN: [id number] Arrival date & time: 10/05/19  1300     History Chief Complaint  Patient presents with  . Loss of Consciousness    Rose Moody is a 79 y.o. female.  Patient is an 79 year old female with a history of hypertension, thyroid disease, elevated intraocular pressure who is presenting today with EMS after a syncopal event at home.  Patient states it was her normal morning this morning she ate breakfast and took her medications.  While she was showering and washing her hair she started feeling nauseated and lightheaded like she was going to pass out.  She got out of the shower and walked into the kitchen to take her prednisone which she was recently started on for new diagnosis of PMR and it was then in the kitchen that she had the syncopal event.  Her daughter was present and caught her and lowered her to the floor.  Patient denies any palpitations, chest pain or shortness of breath.  Her preceding symptoms were nausea and lightheadedness.  Patient reports that last year she had multiple near syncopal events while in the shower she would be washing her hair and would feel nauseated and lightheaded and would have to get out and sit down.  However today she did not sit down.  When she awoke she reports no symptoms at this time and says that she feels her normal self.  Other than starting prednisone about a week ago for the PMR she has had no other medication changes.  The history is provided by the patient.  Loss of Consciousness Episode history:  Single Most recent episode:  Today Timing:  Constant Progression:  Resolved Chronicity:  New      Past Medical History:  Diagnosis Date  . Anemia   . Atherosclerosis of renal artery ([place])   . Detached retina   . Fibromyalgia   . Hypertension   . Hypothyroidism   . Incontinence   . Macular degeneration   . Osteoarthritis    knees and back  . Pernicious anemia     . Stomach ulcer    in her 73s  . Thyroid disease   . UTI (lower urinary tract infection)   . Varicose veins   . Vitamin D deficiency     Patient Active Problem List   Diagnosis Date Noted  . Memory loss 09/20/2018  . Varicose veins of left lower extremity with pain 10/02/2015  . Essential hypertension, benign 07/28/2013  . Varicose veins of lower extremities with other complications [id number]    Past Surgical History:  Procedure Laterality Date  . ABDOMINAL HYSTERECTOMY  age 80  . APPENDECTOMY  age 38  . BLADDER SUSPENSION  age 33  . COLONOSCOPY    . ENDOVENOUS ABLATION SAPHENOUS VEIN W/ LASER Left 01-16-2016   endovenous laser ablation left small saphenous vein by Curt Jews MD  . laser surgery for retinal detachment Left 2016   Dr. Zigmund Daniel  . RENAL ANGIOGRAM N/A 01/28/2012   Procedure: RENAL ANGIOGRAM;  Surgeon: Jettie Booze, MD;  Location: [facility] CATH LAB;  Service: Cardiovascular;  Laterality: N/A;  . stab phlebectomy Right 12-08-2012   > 20 incisions by Curt Jews MD     OB History   No obstetric history on file.     Family History  Problem Relation Age of Onset  . Cancer Mother        leukemia  . Stroke Mother   .  Melanoma Mother   . Heart disease Father   . Heart disease Sister   . Congestive Heart Failure Sister   . Pneumonia Sister   . Bone cancer Brother   . Pneumonia Sister   . Rheum arthritis Brother   . Heart disease Brother   . Heart disease Brother        open heart surgery  . Dementia Neg Hx     Social History   Tobacco Use  . Smoking status: Never Smoker  . Smokeless tobacco: Never Used  Substance Use Topics  . Alcohol use: Never    Alcohol/week: 0.0 standard drinks  . Drug use: Never    Home Medications Prior to Admission medications   Medication Sig Start Date End Date Taking? Authorizing Provider  Calcium Carbonate-Vitamin D (CALCIUM + D PO) Take 1 tablet by mouth 2 (two) times daily. Reported on 01/16/2016    [provider]  cephALEXin (KEFLEX) 250 MG capsule Take 250 mg by mouth every evening.    [provider]  cyanocobalamin (,VITAMIN B-12,) 1000 MCG/ML injection Inject 1,000 mcg into the muscle every 30 (thirty) days. Given in doctor's office     [provider]  diphenhydrAMINE (BENADRYL) 25 mg capsule Take 50 mg by mouth at bedtime.     [provider]  estradiol (ESTRACE) 0.1 MG/GM vaginal cream Place 1 g vaginally once a week. Sundays    [provider]  ipratropium (ATROVENT) 0.03 % nasal spray Place 2-3 sprays into both nostrils daily. 06/30/17   [provider]  magnesium oxide (MAG-OX) 400 MG tablet Take 1-2 tablets by mouth daily. 06/07/17   [provider]  metoprolol tartrate (LOPRESSOR) 50 MG tablet Take 1 tablet 2 hours prior to Cardiac CT 09/16/18   Jettie Booze, MD  Multiple Vitamins-Minerals (CENTRUM SILVER ULTRA WOMENS) TABS Take 1 tablet by mouth every morning.     [provider]  Multiple Vitamins-Minerals (PRESERVISION/LUTEIN PO) Take 1 tablet by mouth 2 (two) times daily.    [provider]  predniSONE (DELTASONE) 50 MG tablet Take 1 tablet (50 mg total) by mouth 13 hours, 7 hours, and 1 hour prior to Cardiac CT 09/16/18   Jettie Booze, MD  sucralfate (CARAFATE) 1 g tablet  08/23/18   [provider]  SYNTHROID 100 MCG tablet  07/28/18   [provider]  valsartan (DIOVAN) 80 MG tablet Take 1 tablet (80 mg total) by mouth 2 (two) times daily. Please schedule annual appt with Dr. Irish Lack for refills. [phone]. 1st attempt. 10/05/19   Jettie Booze, MD    Allergies    Trazodone and nefazodone, Minoxidil, Codeine, Contrast media [iodinated diagnostic agents], Cortisone, Dyclonine, Fesoterodine, Lisinopril, Spironolactone, Tequila sunrise flavor, Toviaz [fesoterodine fumarate], Astelin, Azelastine, Azithromycin, Cefuroxime axetil, Ciprofloxacin, Estradiol, Metrizamide,  Ondansetron, Zithromax [azithromycin dihydrate], and Zofran  Review of Systems   Review of Systems  Cardiovascular: Positive for syncope.  All other systems reviewed and are negative.   Physical Exam Updated Vital Signs BP (!) 164/58 (BP Location: Right Arm)   Pulse 65   Temp 97.8 F (36.6 C) (Oral)   Resp 14   Ht 5\' 2"  (1.575 m)   Wt 70.8 kg   SpO2 98%   BMI 28.53 kg/m   Physical Exam Vitals and nursing note reviewed.  Constitutional:      General: She is not in acute distress.    Appearance: Normal appearance. She is well-developed and normal weight.  HENT:  Head: Normocephalic and atraumatic.  Eyes:     Pupils: Pupils are equal, round, and reactive to light.  Cardiovascular:     Rate and Rhythm: Normal rate and regular rhythm.     Pulses: Normal pulses.     Heart sounds: Normal heart sounds. No murmur. No friction rub.  Pulmonary:     Effort: Pulmonary effort is normal.     Breath sounds: Normal breath sounds. No wheezing or rales.  Abdominal:     General: Bowel sounds are normal. There is no distension.     Palpations: Abdomen is soft.     Tenderness: There is no abdominal tenderness. There is no guarding or rebound.  Musculoskeletal:        General: Tenderness present. Normal range of motion.     Left ankle: Normal.     Left foot: Normal range of motion. Tenderness and bony tenderness present. Normal pulse.       Legs:     Comments: No edema  Skin:    General: Skin is warm and dry.     Findings: No rash.  Neurological:     General: No focal deficit present.     Mental Status: She is alert and oriented to person, place, and time. Mental status is at baseline.     Cranial Nerves: No cranial nerve deficit, dysarthria or facial asymmetry.     Sensory: Sensation is intact.     Motor: Motor function is intact. No weakness or pronator drift.     Coordination: Coordination is intact.  Psychiatric:        Behavior: Behavior normal.     ED Results /  Procedures / Treatments   Labs (all labs ordered are listed, but only abnormal results are displayed) Labs Reviewed  CBC WITH DIFFERENTIAL/PLATELET - Abnormal; Notable for the following components:      Result Value   WBC 13.1 (*)    Hemoglobin 11.6 (*)    Neutro Abs 8.6 (*)    Monocytes Absolute 1.1 (*)    Abs Immature Granulocytes 0.14 (*)    All other components within normal limits  COMPREHENSIVE METABOLIC PANEL - Abnormal; Notable for the following components:   Glucose, Bld 137 (*)    BUN 30 (*)    Creatinine, Ser 1.26 (*)    Calcium 8.8 (*)    Total Protein 6.0 (*)    Albumin 3.3 (*)    GFR calc non Af Amer 40 (*)    GFR calc Af Amer 47 (*)    All other components within normal limits  CBG MONITORING, ED - Abnormal; Notable for the following components:   Glucose-Capillary 136 (*)    All other components within normal limits    EKG EKG Interpretation  Date/Time:  Thursday October 05 2019 13:10:16 EST Ventricular Rate:  63 PR Interval:    QRS Duration: 84 QT Interval:  405 QTC Calculation: 415 R Axis:   55 Text Interpretation: Sinus rhythm Probable left atrial enlargement Minimal ST depression, lateral leads Confirmed by Blanchie Dessert [id number]) on 10/05/2019 1:34:21 PM   Radiology DG Chest Port 1 View  Result Date: 10/05/2019 CLINICAL DATA:  Syncope with lethargy EXAM: PORTABLE CHEST 1 VIEW COMPARISON:  July 07, 2017 FINDINGS: The lungs are clear. Heart is upper normal in size with pulmonary vascularity normal. No adenopathy. No bone lesions. IMPRESSION: Lungs clear.  Heart upper normal in size.  No evident adenopathy. Electronically Signed   By: Lowella Grip III M.D.  On: 10/05/2019 13:44   DG Foot Complete Left  Result Date: 10/05/2019 CLINICAL DATA:  Left foot pain EXAM: LEFT FOOT - COMPLETE 3+ VIEW COMPARISON:  None. FINDINGS: Acute or subacute appearing transversely oriented nondisplaced fracture involving the proximal metaphysis of the second  metatarsal. No malalignment of the TMT joints. No additional fractures are seen. Mild-to-moderate osteoarthritis of the first MTP joint. No focal soft tissue swelling. IMPRESSION: Acute or subacute transversely-oriented nondisplaced fracture involving the proximal metaphysis of the second metatarsal. Electronically Signed   By: Davina Poke D.O.   On: 10/05/2019 14:08    Procedures Procedures (including critical care time)  Medications Ordered in ED Medications - No data to display  ED Course  I have reviewed the triage vital signs and the nursing notes.  Pertinent labs & imaging results that were available during my care of the patient were reviewed by me and considered in my medical decision making (see chart for details).    MDM Rules/Calculators/A&P                     Elderly female presenting today after a syncopal event at home.  Patient did have preceding symptoms prior to the event this could be carotid sinus versus vasovagal event.  Patient states that she has been near syncopal multiple times while in the shower washing her hair which is what happened today as well but she did not sit down.  She has recently started prednisone for PMR but denies any chest pain, shortness of breath, palpitations.  She denies any cardiac symptoms.  She has been eating and drinking normally.  Well-appearing today with no murmurs concerning for aortic stenosis.  Will check labs to ensure normal electrolytes.  And chest x-ray.  Also patient is having pain in her left foot after the syncopal event and concerned she may have twisted it when she fell.  She was able to ambulate after the event and denied any recurrent symptoms.   Labs today are significant for a leukocytosis of 13,000 which is most likely related to the recent starting of prednisone.  Normal platelet count and blood counts.  CMP with mild AKI today with a creatinine of 1.26 from 1.0 and mild elevation of BUN today.  Patient does have a new  second proximal metatarsal fracture and spoke with [facility] PA who recommended a hard soled shoe and follow-up with Dr. Lucia Gaskins or Doran Durand early next week.  Otherwise patient's blood pressure has been stable and vital signs are reassuring.  3:01 PM Patient displays no sign of stroke however patient's daughter reports that she had left-sided weakness and some slurred speech before the syncopal event today and they were concerned that she was having a stroke.  Here patient's neurologic exam is within normal limits.  However we will do an MRI to ensure no strokelike findings.  4:35 PM MRI pending.  Pt was able to ambulate to bathroom without difficulty.  Pt checked out to Dr. Eulis Foster.   Final Clinical Impression(s) / ED Diagnoses Final diagnoses:  None    Rx / DC Orders ED Discharge Orders    None       Blanchie Dessert, MD 10/05/19 1635

## 2019-10-05 NOTE — Discharge Instructions (Signed)
When you passed out today you did get a small fracture of your second metatarsal which is a bone in your foot.  You were supposed to wear the postop shoe anytime you are up walking around and it will be important to follow-up with the orthopedics next week so they can follow this and ensure it is healing.  Your lab work today was reassuring with normal electrolytes and hemoglobin levels were good.  EKG of your heart and chest x-ray were also normal.  This may happen from having your arms over your head and being in the shower and stimulating a nerve that drops her blood pressure.  It may be good to sit down on the seat in the shower when you are washing your hair or if you do develop symptoms you need to sit down immediately.

## 2019-10-05 NOTE — ED Notes (Signed)
Pt ent to mri

## 2019-10-05 NOTE — ED Triage Notes (Signed)
Pt bib ems, Pt getting ready for the day and became lethargic, was assisted to the ground by daughter, pt was ambulatory on scene.   Pt aox4 upon arrival. 160/100 Hr 60 rr 18 o2 99%ra cbg 147 97.7*f

## 2019-10-05 NOTE — ED Notes (Signed)
CBG Results of 136 reported to Phill, RN.

## 2019-10-05 NOTE — Progress Notes (Signed)
Orthopedic Tech Progress Note Patient Details:  Rose Moody [birth year] [id number] Just received a call about POST OP SHOE for patient.. I applied socks then the shoe to patient foot. Ortho Devices Type of Ortho Device: Postop shoe/boot Ortho Device/Splint Location: LLE Ortho Device/Splint Interventions: Ordered, Application   Post Interventions Patient Tolerated: Well Instructions Provided: Care of device, Adjustment of device   Janit Pagan 10/05/2019, 6:25 PM

## 2019-10-09 DIAGNOSIS — H33002 Unspecified retinal detachment with retinal break, left eye: Secondary | ICD-10-CM | POA: Diagnosis not present

## 2019-10-09 DIAGNOSIS — Z961 Presence of intraocular lens: Secondary | ICD-10-CM | POA: Diagnosis not present

## 2019-10-09 DIAGNOSIS — H43813 Vitreous degeneration, bilateral: Secondary | ICD-10-CM | POA: Diagnosis not present

## 2019-10-09 DIAGNOSIS — R519 Headache, unspecified: Secondary | ICD-10-CM | POA: Diagnosis not present

## 2019-10-09 DIAGNOSIS — H353131 Nonexudative age-related macular degeneration, bilateral, early dry stage: Secondary | ICD-10-CM | POA: Diagnosis not present

## 2019-10-09 DIAGNOSIS — H40053 Ocular hypertension, bilateral: Secondary | ICD-10-CM | POA: Diagnosis not present

## 2019-10-10 ENCOUNTER — Encounter: Payer: Self-pay | Admitting: Vascular Surgery

## 2019-10-10 ENCOUNTER — Other Ambulatory Visit: Payer: Self-pay

## 2019-10-10 ENCOUNTER — Ambulatory Visit (INDEPENDENT_AMBULATORY_CARE_PROVIDER_SITE_OTHER): Payer: PPO | Admitting: Vascular Surgery

## 2019-10-10 VITALS — BP 145/75 | HR 68 | Temp 96.3°F | Resp 18 | Ht 62.5 in | Wt 159.5 lb

## 2019-10-10 DIAGNOSIS — M316 Other giant cell arteritis: Secondary | ICD-10-CM

## 2019-10-10 NOTE — Progress Notes (Signed)
Vascular and Vein Specialist of [facility]  Patient name: Rose Moody MRN: [id number] DOB: Jul 08, 1941 Sex: female  REASON FOR CONSULT: Valuate for temporal artery biopsy to rule out temporal arteritis.  HPI: Rose Moody is a 79 y.o. female, who is here today for discussion of temporal artery biopsy.  She has an elevated sed rate and visual changes concerning for giant cell arteritis.  She saw Dr. Richardson Chiquito and has been referred.  She does report history of temporal headache dating back many years.  No prior history of diagnosed vasculitis.  Multiple medical problems as outlined below.  Had seen her in the past for treatments of varicosities in her lower extremities  Past Medical History:  Diagnosis Date  . Anemia   . Atherosclerosis of renal artery ([place])   . Detached retina   . Fibromyalgia   . Hypertension   . Hypothyroidism   . Incontinence   . Macular degeneration   . Osteoarthritis    knees and back  . Pernicious anemia   . Stomach ulcer    in her 25s  . Thyroid disease   . UTI (lower urinary tract infection)   . Varicose veins   . Vitamin D deficiency     Family History  Problem Relation Age of Onset  . Cancer Mother        leukemia  . Stroke Mother   . Melanoma Mother   . Heart disease Father   . Heart disease Sister   . Congestive Heart Failure Sister   . Pneumonia Sister   . Bone cancer Brother   . Pneumonia Sister   . Rheum arthritis Brother   . Heart disease Brother   . Heart disease Brother        open heart surgery  . Dementia Neg Hx     SOCIAL HISTORY: Social History   Socioeconomic History  . Marital status: Divorced    Spouse name: Not on file  . Number of children: 2  . Years of education: 75  . Highest education level: Not on file  Occupational History  . Not on file  Tobacco Use  . Smoking status: Never Smoker  . Smokeless tobacco: Never Used  Substance and Sexual Activity  . Alcohol use: Never    Alcohol/week: 0.0 standard drinks  . Drug use: Never  . Sexual activity: Not on file  Other Topics Concern  . Not on file  Social History Narrative   She lives at home with her daughter. They share a town home.    No caffeine   Right handed   Social Determinants of Health   Financial Resource Strain:   . Difficulty of Paying Living Expenses: Not on file  Food Insecurity:   . Worried About Charity fundraiser in the Last Year: Not on file  . Ran Out of Food in the Last Year: Not on file  Transportation Needs:   . Lack of Transportation (Medical): Not on file  . Lack of Transportation (Non-Medical): Not on file  Physical Activity:   . Days of Exercise per Week: Not on file  . Minutes of Exercise per Session: Not on file  Stress:   . Feeling of Stress : Not on file  Social Connections:   . Frequency of Communication with Friends and Family: Not on file  . Frequency of Social Gatherings with Friends and Family: Not on file  . Attends Religious Services: Not on file  . Active Member of Clubs or  Organizations: Not on file  . Attends Archivist Meetings: Not on file  . Marital Status: Not on file  Intimate Partner Violence:   . Fear of Current or Ex-Partner: Not on file  . Emotionally Abused: Not on file  . Physically Abused: Not on file  . Sexually Abused: Not on file    Allergies  Allergen Reactions  . Trazodone And Nefazodone Other (See Comments)    Chest pain  . Minoxidil     [place] and tachycardia  . Codeine Nausea And Vomiting  . Contrast Media [Iodinated Diagnostic Agents]     Rash - given during an eye procedure  . Cortisone Other (See Comments) and Swelling    Body swelling. Has taken other steroids Body swelling. Has taken other steroids  . Dyclonine Other (See Comments)    Pt. States she experienced a seizure after taking this medication. Pt. States she experienced a seizure after taking this medication.  . Fesoterodine Other (See Comments)     Dehydration and confusion  . Lisinopril Cough and Other (See Comments)  . Spironolactone     Full body rash  . [employer]     unknown  . Toviaz [Fesoterodine Fumarate] Other (See Comments)    Dehydration and confusion  . Astelin Rash  . Azelastine Rash  . Azithromycin Rash  . Cefuroxime Axetil Rash  . Ciprofloxacin Rash  . Metrizamide Rash    Rash - given during an eye procedure  . Ondansetron Rash  . Zithromax [Azithromycin Dihydrate] Rash  . Zofran Rash    Current Outpatient Medications  Medication Sig Dispense Refill  . Calcium Carbonate-Vitamin D (CALCIUM + D PO) Take 1 tablet by mouth 2 (two) times daily. Reported on 01/16/2016    . cephALEXin (KEFLEX) 250 MG capsule Take 250 mg by mouth every evening.    . cyanocobalamin (,VITAMIN B-12,) 1000 MCG/ML injection Inject 1,000 mcg into the muscle every 30 (thirty) days. Given in doctor's office     . diphenhydrAMINE (BENADRYL) 25 mg capsule Take 50 mg by mouth at bedtime.     . dorzolamide-timolol (COSOPT) 22.3-6.8 MG/ML ophthalmic solution 1 drop 2 (two) times daily.    Marland Kitchen estradiol (ESTRACE) 0.1 MG/GM vaginal cream Place 1 g vaginally once a week. Sundays    . ipratropium (ATROVENT) 0.06 % nasal spray Place 2-3 sprays into both nostrils daily.     Marland Kitchen latanoprost (XALATAN) 0.005 % ophthalmic solution 1 drop at bedtime.    . metoprolol tartrate (LOPRESSOR) 50 MG tablet Take 1 tablet 2 hours prior to Cardiac CT 1 tablet 0  . Multiple Vitamins-Minerals (CENTRUM SILVER ULTRA WOMENS) TABS Take 1 tablet by mouth every morning.     . Multiple Vitamins-Minerals (PRESERVISION/LUTEIN PO) Take 1 tablet by mouth 2 (two) times daily.    Vladimir Faster Glycol-Propyl Glycol (SYSTANE ULTRA OP) Apply 1 drop to eye daily as needed.    . predniSONE (DELTASONE) 10 MG tablet Take 40 mg by mouth daily.    . sucralfate (CARAFATE) 1 g tablet Take 1 g by mouth 2 (two) times daily.     Marland Kitchen SYNTHROID 100 MCG tablet Take 100 mcg by mouth daily before  breakfast.     . valsartan (DIOVAN) 80 MG tablet Take 1 tablet (80 mg total) by mouth 2 (two) times daily. Please schedule annual appt with Dr. Irish Lack for refills. [phone]. 1st attempt. 60 tablet 0  . fluticasone (FLONASE) 50 MCG/ACT nasal spray Place 1 spray into both nostrils in  the morning and at bedtime. Use for 1 week. (Patient not taking: Reported on 10/10/2019) 1 g 0  . predniSONE (DELTASONE) 50 MG tablet Take 1 tablet (50 mg total) by mouth 13 hours, 7 hours, and 1 hour prior to Cardiac CT (Patient not taking: Reported on 10/10/2019) 3 tablet 0   No current facility-administered medications for this visit.   Facility-Administered Medications Ordered in Other Visits  Medication Dose Route Frequency Provider Last Rate Last Admin  . diphenhydrAMINE (BENADRYL) injection 25 mg  25 mg Intravenous Once Jettie Booze, MD      . famotidine (PEPCID) 20 mg in sodium chloride 0.9 % 50 mL IVPB  20 mg Intravenous Once Jettie Booze, MD        REVIEW OF SYSTEMS:  [X]  denotes positive finding, [ ]  denotes negative finding Cardiac  Comments:  Chest pain or chest pressure:    Shortness of breath upon exertion:    Short of breath when lying flat:    Irregular heart rhythm:        Vascular    Pain in calf, thigh, or hip brought on by ambulation: x   Pain in feet at night that wakes you up from your sleep:     Blood clot in your veins:    Leg swelling:  x       Pulmonary    Oxygen at home:    Productive cough:     Wheezing:         Neurologic    Sudden weakness in arms or legs:  x   Sudden numbness in arms or legs:     Sudden onset of difficulty speaking or slurred speech:    Temporary loss of vision in one eye:     Problems with dizziness:         Gastrointestinal    Blood in stool:     Vomited blood:         Genitourinary    Burning when urinating:     Blood in urine:        Psychiatric    Major depression:         Hematologic    Bleeding problems:      Problems with blood clotting too easily:        Skin    Rashes or ulcers:        Constitutional    Fever or chills:      PHYSICAL EXAM: Vitals:   10/10/19 0854  BP: (!) 145/75  Pulse: 68  Resp: 18  Temp: (!) 96.3 F (35.7 C)  TempSrc: Temporal  SpO2: 100%  Weight: 159 lb 8 oz (72.3 kg)  Height: 5' 2.5" (1.588 m)    GENERAL: The patient is a well-nourished female, in no acute distress. The vital signs are documented above. CARDIOVASCULAR: Carotid arteries without bruits bilaterally.  2+ radial pulses bilaterally PULMONARY: There is good air exchange  ABDOMEN: Soft and non-tender  MUSCULOSKELETAL: There are no major deformities or cyanosis. NEUROLOGIC: No focal weakness or paresthesias are detected. SKIN: There are no ulcers or rashes noted. PSYCHIATRIC: The patient has a normal affect.  DATA:  None  MEDICAL ISSUES: I discussed the concern with the patient.  Explained our role in the diagnosis with temporal artery biopsy.  Explained the treatment would be directed with steroids depending on the biopsy.  I cannot tell from Dr. Zenia Resides notes specific concern for laterality.  We will confirm this prior to surgery.  Explained to  the patient this would be done as an outpatient at [facility].  Explained that the incision would be approximately 1 inch long directly anterior to her ear.  We will schedule this at her earliest [place]. Adryanna Friedt, MD [facility] Vascular and Vein Specialists of [facility] Tel [phone] Pager [phone]

## 2019-10-10 NOTE — H&P (View-Only) (Signed)
Vascular and Vein Specialist of [facility]  Patient name: Rose Moody MRN: [id number] DOB: 1941/05/21 Sex: female  REASON FOR CONSULT: Valuate for temporal artery biopsy to rule out temporal arteritis.  HPI: Rose Moody is a 79 y.o. female, who is here today for discussion of temporal artery biopsy.  She has an elevated sed rate and visual changes concerning for giant cell arteritis.  She saw Dr. Richardson Chiquito and has been referred.  She does report history of temporal headache dating back many years.  No prior history of diagnosed vasculitis.  Multiple medical problems as outlined below.  Had seen her in the past for treatments of varicosities in her lower extremities  Past Medical History:  Diagnosis Date  . Anemia   . Atherosclerosis of renal artery ([place])   . Detached retina   . Fibromyalgia   . Hypertension   . Hypothyroidism   . Incontinence   . Macular degeneration   . Osteoarthritis    knees and back  . Pernicious anemia   . Stomach ulcer    in her 31s  . Thyroid disease   . UTI (lower urinary tract infection)   . Varicose veins   . Vitamin D deficiency     Family History  Problem Relation Age of Onset  . Cancer Mother        leukemia  . Stroke Mother   . Melanoma Mother   . Heart disease Father   . Heart disease Sister   . Congestive Heart Failure Sister   . Pneumonia Sister   . Bone cancer Brother   . Pneumonia Sister   . Rheum arthritis Brother   . Heart disease Brother   . Heart disease Brother        open heart surgery  . Dementia Neg Hx     SOCIAL HISTORY: Social History   Socioeconomic History  . Marital status: Divorced    Spouse name: Not on file  . Number of children: 2  . Years of education: 2  . Highest education level: Not on file  Occupational History  . Not on file  Tobacco Use  . Smoking status: Never Smoker  . Smokeless tobacco: Never Used  Substance and Sexual Activity  . Alcohol use: Never    Alcohol/week: 0.0 standard drinks  . Drug use: Never  . Sexual activity: Not on file  Other Topics Concern  . Not on file  Social History Narrative   She lives at home with her daughter. They share a town home.    No caffeine   Right handed   Social Determinants of Health   Financial Resource Strain:   . Difficulty of Paying Living Expenses: Not on file  Food Insecurity:   . Worried About Charity fundraiser in the Last Year: Not on file  . Ran Out of Food in the Last Year: Not on file  Transportation Needs:   . Lack of Transportation (Medical): Not on file  . Lack of Transportation (Non-Medical): Not on file  Physical Activity:   . Days of Exercise per Week: Not on file  . Minutes of Exercise per Session: Not on file  Stress:   . Feeling of Stress : Not on file  Social Connections:   . Frequency of Communication with Friends and Family: Not on file  . Frequency of Social Gatherings with Friends and Family: Not on file  . Attends Religious Services: Not on file  . Active Member of Clubs or  Organizations: Not on file  . Attends Archivist Meetings: Not on file  . Marital Status: Not on file  Intimate Partner Violence:   . Fear of Current or Ex-Partner: Not on file  . Emotionally Abused: Not on file  . Physically Abused: Not on file  . Sexually Abused: Not on file    Allergies  Allergen Reactions  . Trazodone And Nefazodone Other (See Comments)    Chest pain  . Minoxidil     [place] and tachycardia  . Codeine Nausea And Vomiting  . Contrast Media [Iodinated Diagnostic Agents]     Rash - given during an eye procedure  . Cortisone Other (See Comments) and Swelling    Body swelling. Has taken other steroids Body swelling. Has taken other steroids  . Dyclonine Other (See Comments)    Pt. States she experienced a seizure after taking this medication. Pt. States she experienced a seizure after taking this medication.  . Fesoterodine Other (See Comments)     Dehydration and confusion  . Lisinopril Cough and Other (See Comments)  . Spironolactone     Full body rash  . [employer]     unknown  . Toviaz [Fesoterodine Fumarate] Other (See Comments)    Dehydration and confusion  . Astelin Rash  . Azelastine Rash  . Azithromycin Rash  . Cefuroxime Axetil Rash  . Ciprofloxacin Rash  . Metrizamide Rash    Rash - given during an eye procedure  . Ondansetron Rash  . Zithromax [Azithromycin Dihydrate] Rash  . Zofran Rash    Current Outpatient Medications  Medication Sig Dispense Refill  . Calcium Carbonate-Vitamin D (CALCIUM + D PO) Take 1 tablet by mouth 2 (two) times daily. Reported on 01/16/2016    . cephALEXin (KEFLEX) 250 MG capsule Take 250 mg by mouth every evening.    . cyanocobalamin (,VITAMIN B-12,) 1000 MCG/ML injection Inject 1,000 mcg into the muscle every 30 (thirty) days. Given in doctor's office     . diphenhydrAMINE (BENADRYL) 25 mg capsule Take 50 mg by mouth at bedtime.     . dorzolamide-timolol (COSOPT) 22.3-6.8 MG/ML ophthalmic solution 1 drop 2 (two) times daily.    Marland Kitchen estradiol (ESTRACE) 0.1 MG/GM vaginal cream Place 1 g vaginally once a week. Sundays    . ipratropium (ATROVENT) 0.06 % nasal spray Place 2-3 sprays into both nostrils daily.     Marland Kitchen latanoprost (XALATAN) 0.005 % ophthalmic solution 1 drop at bedtime.    . metoprolol tartrate (LOPRESSOR) 50 MG tablet Take 1 tablet 2 hours prior to Cardiac CT 1 tablet 0  . Multiple Vitamins-Minerals (CENTRUM SILVER ULTRA WOMENS) TABS Take 1 tablet by mouth every morning.     . Multiple Vitamins-Minerals (PRESERVISION/LUTEIN PO) Take 1 tablet by mouth 2 (two) times daily.    Vladimir Faster Glycol-Propyl Glycol (SYSTANE ULTRA OP) Apply 1 drop to eye daily as needed.    . predniSONE (DELTASONE) 10 MG tablet Take 40 mg by mouth daily.    . sucralfate (CARAFATE) 1 g tablet Take 1 g by mouth 2 (two) times daily.     Marland Kitchen SYNTHROID 100 MCG tablet Take 100 mcg by mouth daily before  breakfast.     . valsartan (DIOVAN) 80 MG tablet Take 1 tablet (80 mg total) by mouth 2 (two) times daily. Please schedule annual appt with Dr. Irish Lack for refills. [phone]. 1st attempt. 60 tablet 0  . fluticasone (FLONASE) 50 MCG/ACT nasal spray Place 1 spray into both nostrils in  the morning and at bedtime. Use for 1 week. (Patient not taking: Reported on 10/10/2019) 1 g 0  . predniSONE (DELTASONE) 50 MG tablet Take 1 tablet (50 mg total) by mouth 13 hours, 7 hours, and 1 hour prior to Cardiac CT (Patient not taking: Reported on 10/10/2019) 3 tablet 0   No current facility-administered medications for this visit.   Facility-Administered Medications Ordered in Other Visits  Medication Dose Route Frequency Provider Last Rate Last Admin  . diphenhydrAMINE (BENADRYL) injection 25 mg  25 mg Intravenous Once Jettie Booze, MD      . famotidine (PEPCID) 20 mg in sodium chloride 0.9 % 50 mL IVPB  20 mg Intravenous Once Jettie Booze, MD        REVIEW OF SYSTEMS:  [X]  denotes positive finding, [ ]  denotes negative finding Cardiac  Comments:  Chest pain or chest pressure:    Shortness of breath upon exertion:    Short of breath when lying flat:    Irregular heart rhythm:        Vascular    Pain in calf, thigh, or hip brought on by ambulation: x   Pain in feet at night that wakes you up from your sleep:     Blood clot in your veins:    Leg swelling:  x       Pulmonary    Oxygen at home:    Productive cough:     Wheezing:         Neurologic    Sudden weakness in arms or legs:  x   Sudden numbness in arms or legs:     Sudden onset of difficulty speaking or slurred speech:    Temporary loss of vision in one eye:     Problems with dizziness:         Gastrointestinal    Blood in stool:     Vomited blood:         Genitourinary    Burning when urinating:     Blood in urine:        Psychiatric    Major depression:         Hematologic    Bleeding problems:      Problems with blood clotting too easily:        Skin    Rashes or ulcers:        Constitutional    Fever or chills:      PHYSICAL EXAM: Vitals:   10/10/19 0854  BP: (!) 145/75  Pulse: 68  Resp: 18  Temp: (!) 96.3 F (35.7 C)  TempSrc: Temporal  SpO2: 100%  Weight: 159 lb 8 oz (72.3 kg)  Height: 5' 2.5" (1.588 m)    GENERAL: The patient is a well-nourished female, in no acute distress. The vital signs are documented above. CARDIOVASCULAR: Carotid arteries without bruits bilaterally.  2+ radial pulses bilaterally PULMONARY: There is good air exchange  ABDOMEN: Soft and non-tender  MUSCULOSKELETAL: There are no major deformities or cyanosis. NEUROLOGIC: No focal weakness or paresthesias are detected. SKIN: There are no ulcers or rashes noted. PSYCHIATRIC: The patient has a normal affect.  DATA:  None  MEDICAL ISSUES: I discussed the concern with the patient.  Explained our role in the diagnosis with temporal artery biopsy.  Explained the treatment would be directed with steroids depending on the biopsy.  I cannot tell from Dr. Zenia Resides notes specific concern for laterality.  We will confirm this prior to surgery.  Explained to  the patient this would be done as an outpatient at [facility].  Explained that the incision would be approximately 1 inch long directly anterior to her ear.  We will schedule this at her earliest [place]. Savon Bordonaro, MD [facility] Vascular and Vein Specialists of [facility] Tel [phone] Pager [phone]

## 2019-10-11 ENCOUNTER — Other Ambulatory Visit: Payer: Self-pay

## 2019-10-11 DIAGNOSIS — S92325A Nondisplaced fracture of second metatarsal bone, left foot, initial encounter for closed fracture: Secondary | ICD-10-CM | POA: Diagnosis not present

## 2019-10-12 DIAGNOSIS — M353 Polymyalgia rheumatica: Secondary | ICD-10-CM | POA: Diagnosis not present

## 2019-10-12 DIAGNOSIS — E878 Other disorders of electrolyte and fluid balance, not elsewhere classified: Secondary | ICD-10-CM | POA: Diagnosis not present

## 2019-10-12 DIAGNOSIS — K219 Gastro-esophageal reflux disease without esophagitis: Secondary | ICD-10-CM | POA: Diagnosis not present

## 2019-10-12 DIAGNOSIS — R55 Syncope and collapse: Secondary | ICD-10-CM | POA: Diagnosis not present

## 2019-10-13 ENCOUNTER — Other Ambulatory Visit (HOSPITAL_COMMUNITY): Payer: PPO

## 2019-10-16 ENCOUNTER — Other Ambulatory Visit: Payer: Self-pay

## 2019-10-16 ENCOUNTER — Encounter (HOSPITAL_COMMUNITY): Payer: Self-pay | Admitting: Vascular Surgery

## 2019-10-16 NOTE — Progress Notes (Signed)
Spoke with pt for pre-op call. Pt denies cardiac history. Pt is treated for HTN. States she is not diabetic.   Covid test will be done tomorrow. Pt given quarantine instructions and voiced understanding.

## 2019-10-17 ENCOUNTER — Other Ambulatory Visit (HOSPITAL_COMMUNITY)
Admission: RE | Admit: 2019-10-17 | Discharge: 2019-10-17 | Disposition: A | Discharge status: Home / Self Care | Payer: PPO | Source: Ambulatory Visit | Attending: Vascular Surgery | Admitting: Vascular Surgery

## 2019-10-17 DIAGNOSIS — Z01812 Encounter for preprocedural laboratory examination: Secondary | ICD-10-CM | POA: Diagnosis not present

## 2019-10-17 DIAGNOSIS — Z20822 Contact with and (suspected) exposure to covid-19: Secondary | ICD-10-CM | POA: Insufficient documentation

## 2019-10-17 LAB — SARS CORONAVIRUS 2 (TAT 6-24 HRS): SARS Coronavirus 2: NEGATIVE

## 2019-10-17 NOTE — Progress Notes (Signed)
Pt called today and told me that she had inadvertently taken 2 doses of her Synthroid. She states she did call Dr. Drema Dallas office and they told her not to take tomorrow's dose. She just wanted to let us know what she had done and that she wouldn't be taking her Synthroid in the AM.

## 2019-10-18 ENCOUNTER — Other Ambulatory Visit: Payer: Self-pay

## 2019-10-18 ENCOUNTER — Ambulatory Visit (HOSPITAL_COMMUNITY): Payer: PPO | Admitting: Anesthesiology

## 2019-10-18 ENCOUNTER — Encounter (HOSPITAL_COMMUNITY)
Admission: RE | Disposition: A | Discharge status: Home / Self Care | Payer: Self-pay | Source: Home / Self Care | Attending: Vascular Surgery

## 2019-10-18 ENCOUNTER — Ambulatory Visit (HOSPITAL_COMMUNITY)
Admission: RE | Admit: 2019-10-18 | Discharge: 2019-10-18 | Disposition: A | Discharge status: Home / Self Care | Payer: PPO | Attending: Vascular Surgery | Admitting: Vascular Surgery

## 2019-10-18 ENCOUNTER — Encounter (HOSPITAL_COMMUNITY): Payer: Self-pay | Admitting: Vascular Surgery

## 2019-10-18 DIAGNOSIS — I739 Peripheral vascular disease, unspecified: Secondary | ICD-10-CM | POA: Insufficient documentation

## 2019-10-18 DIAGNOSIS — Z806 Family history of leukemia: Secondary | ICD-10-CM | POA: Diagnosis not present

## 2019-10-18 DIAGNOSIS — I701 Atherosclerosis of renal artery: Secondary | ICD-10-CM | POA: Insufficient documentation

## 2019-10-18 DIAGNOSIS — M1990 Unspecified osteoarthritis, unspecified site: Secondary | ICD-10-CM | POA: Insufficient documentation

## 2019-10-18 DIAGNOSIS — E039 Hypothyroidism, unspecified: Secondary | ICD-10-CM | POA: Insufficient documentation

## 2019-10-18 DIAGNOSIS — Z881 Allergy status to other antibiotic agents status: Secondary | ICD-10-CM | POA: Diagnosis not present

## 2019-10-18 DIAGNOSIS — Z885 Allergy status to narcotic agent status: Secondary | ICD-10-CM | POA: Diagnosis not present

## 2019-10-18 DIAGNOSIS — K219 Gastro-esophageal reflux disease without esophagitis: Secondary | ICD-10-CM | POA: Insufficient documentation

## 2019-10-18 DIAGNOSIS — Z888 Allergy status to other drugs, medicaments and biological substances status: Secondary | ICD-10-CM | POA: Diagnosis not present

## 2019-10-18 DIAGNOSIS — H538 Other visual disturbances: Secondary | ICD-10-CM | POA: Insufficient documentation

## 2019-10-18 DIAGNOSIS — Z7952 Long term (current) use of systemic steroids: Secondary | ICD-10-CM | POA: Insufficient documentation

## 2019-10-18 DIAGNOSIS — M797 Fibromyalgia: Secondary | ICD-10-CM | POA: Diagnosis not present

## 2019-10-18 DIAGNOSIS — I10 Essential (primary) hypertension: Secondary | ICD-10-CM | POA: Insufficient documentation

## 2019-10-18 DIAGNOSIS — Z8261 Family history of arthritis: Secondary | ICD-10-CM | POA: Diagnosis not present

## 2019-10-18 DIAGNOSIS — Z808 Family history of malignant neoplasm of other organs or systems: Secondary | ICD-10-CM | POA: Insufficient documentation

## 2019-10-18 DIAGNOSIS — Z8249 Family history of ischemic heart disease and other diseases of the circulatory system: Secondary | ICD-10-CM | POA: Diagnosis not present

## 2019-10-18 DIAGNOSIS — Z823 Family history of stroke: Secondary | ICD-10-CM | POA: Diagnosis not present

## 2019-10-18 DIAGNOSIS — H3530 Unspecified macular degeneration: Secondary | ICD-10-CM | POA: Insufficient documentation

## 2019-10-18 DIAGNOSIS — R32 Unspecified urinary incontinence: Secondary | ICD-10-CM | POA: Insufficient documentation

## 2019-10-18 DIAGNOSIS — Z79899 Other long term (current) drug therapy: Secondary | ICD-10-CM | POA: Insufficient documentation

## 2019-10-18 DIAGNOSIS — I129 Hypertensive chronic kidney disease with stage 1 through stage 4 chronic kidney disease, or unspecified chronic kidney disease: Secondary | ICD-10-CM | POA: Diagnosis not present

## 2019-10-18 DIAGNOSIS — M170 Bilateral primary osteoarthritis of knee: Secondary | ICD-10-CM | POA: Insufficient documentation

## 2019-10-18 DIAGNOSIS — E559 Vitamin D deficiency, unspecified: Secondary | ICD-10-CM | POA: Diagnosis not present

## 2019-10-18 DIAGNOSIS — N183 Chronic kidney disease, stage 3 unspecified: Secondary | ICD-10-CM | POA: Diagnosis not present

## 2019-10-18 DIAGNOSIS — D649 Anemia, unspecified: Secondary | ICD-10-CM | POA: Diagnosis not present

## 2019-10-18 DIAGNOSIS — H539 Unspecified visual disturbance: Secondary | ICD-10-CM | POA: Diagnosis not present

## 2019-10-18 DIAGNOSIS — M316 Other giant cell arteritis: Secondary | ICD-10-CM | POA: Diagnosis not present

## 2019-10-18 DIAGNOSIS — Z8711 Personal history of peptic ulcer disease: Secondary | ICD-10-CM | POA: Diagnosis not present

## 2019-10-18 HISTORY — PX: ARTERY BIOPSY: SHX891

## 2019-10-18 HISTORY — DX: Chronic kidney disease, unspecified: N18.9

## 2019-10-18 HISTORY — DX: Pneumonia, unspecified organism: J18.9

## 2019-10-18 HISTORY — DX: Headache, unspecified: R51.9

## 2019-10-18 HISTORY — DX: Anxiety disorder, unspecified: F41.9

## 2019-10-18 HISTORY — DX: Gastro-esophageal reflux disease without esophagitis: K21.9

## 2019-10-18 HISTORY — DX: Family history of other specified conditions: Z84.89

## 2019-10-18 LAB — SURGICAL PCR SCREEN
MRSA, PCR: NEGATIVE
Staphylococcus aureus: NEGATIVE

## 2019-10-18 SURGERY — BIOPSY TEMPORAL ARTERY
Anesthesia: Monitor Anesthesia Care | Laterality: Left

## 2019-10-18 MED ORDER — DIPHENHYDRAMINE HCL 50 MG/ML IJ SOLN
INTRAMUSCULAR | Status: DC | PRN
  Administered 2019-10-18: 12.5 mg via INTRAVENOUS

## 2019-10-18 MED ORDER — PHENYLEPHRINE 40 MCG/ML (10ML) SYRINGE FOR IV PUSH (FOR BLOOD PRESSURE SUPPORT)
PREFILLED_SYRINGE | INTRAVENOUS | Status: AC
  Filled 2019-10-18: qty 10

## 2019-10-18 MED ORDER — VANCOMYCIN HCL IN DEXTROSE 1-5 GM/200ML-% IV SOLN
1000.0000 mg | INTRAVENOUS | Status: AC
  Administered 2019-10-18: 1000 mg via INTRAVENOUS
  Filled 2019-10-18: qty 200

## 2019-10-18 MED ORDER — FENTANYL CITRATE (PF) 100 MCG/2ML IJ SOLN: 25.0000 ug | INTRAMUSCULAR | Status: DC | PRN

## 2019-10-18 MED ORDER — FENTANYL CITRATE (PF) 250 MCG/5ML IJ SOLN
INTRAMUSCULAR | Status: DC | PRN
  Administered 2019-10-18: 25 ug via INTRAVENOUS
  Administered 2019-10-18: 50 ug via INTRAVENOUS

## 2019-10-18 MED ORDER — CHLORHEXIDINE GLUCONATE 4 % EX LIQD: 60.0000 mL | Freq: Once | CUTANEOUS | Status: DC

## 2019-10-18 MED ORDER — DEXMEDETOMIDINE HCL 200 MCG/2ML IV SOLN
INTRAVENOUS | Status: DC | PRN
  Administered 2019-10-18 (×3): 4 ug via INTRAVENOUS

## 2019-10-18 MED ORDER — LIDOCAINE 2% (20 MG/ML) 5 ML SYRINGE
INTRAMUSCULAR | Status: DC | PRN
  Administered 2019-10-18: 4 mg via INTRAVENOUS

## 2019-10-18 MED ORDER — MIDAZOLAM HCL 2 MG/2ML IJ SOLN
INTRAMUSCULAR | Status: AC
  Filled 2019-10-18: qty 2

## 2019-10-18 MED ORDER — LIDOCAINE 2% (20 MG/ML) 5 ML SYRINGE
INTRAMUSCULAR | Status: AC
  Filled 2019-10-18: qty 5

## 2019-10-18 MED ORDER — PROPOFOL 500 MG/50ML IV EMUL
INTRAVENOUS | Status: DC | PRN
  Administered 2019-10-18: 25 ug/kg/min via INTRAVENOUS

## 2019-10-18 MED ORDER — DIPHENHYDRAMINE HCL 50 MG/ML IJ SOLN
INTRAMUSCULAR | Status: AC
  Filled 2019-10-18: qty 1

## 2019-10-18 MED ORDER — LACTATED RINGERS IV SOLN: INTRAVENOUS | Status: DC

## 2019-10-18 MED ORDER — MUPIROCIN 2 % EX OINT
TOPICAL_OINTMENT | CUTANEOUS | Status: AC
  Administered 2019-10-18: 1 via TOPICAL
  Filled 2019-10-18: qty 22

## 2019-10-18 MED ORDER — PROPOFOL 1000 MG/100ML IV EMUL
INTRAVENOUS | Status: AC
  Filled 2019-10-18: qty 100

## 2019-10-18 MED ORDER — 0.9 % SODIUM CHLORIDE (POUR BTL) OPTIME
TOPICAL | Status: DC | PRN
  Administered 2019-10-18: 1000 mL

## 2019-10-18 MED ORDER — MUPIROCIN 2 % EX OINT: 1.0000 "application " | TOPICAL_OINTMENT | Freq: Once | CUTANEOUS | Status: AC

## 2019-10-18 MED ORDER — PROPOFOL 10 MG/ML IV BOLUS
INTRAVENOUS | Status: DC | PRN
  Administered 2019-10-18: 10 mg via INTRAVENOUS
  Administered 2019-10-18: 20 mg via INTRAVENOUS

## 2019-10-18 MED ORDER — LIDOCAINE-EPINEPHRINE 0.5 %-1:200000 IJ SOLN
INTRAMUSCULAR | Status: DC | PRN
  Administered 2019-10-18: 2 mL

## 2019-10-18 MED ORDER — FENTANYL CITRATE (PF) 250 MCG/5ML IJ SOLN
INTRAMUSCULAR | Status: AC
  Filled 2019-10-18: qty 5

## 2019-10-18 MED ORDER — PHENYLEPHRINE 40 MCG/ML (10ML) SYRINGE FOR IV PUSH (FOR BLOOD PRESSURE SUPPORT)
PREFILLED_SYRINGE | INTRAVENOUS | Status: DC | PRN
  Administered 2019-10-18 (×2): 40 ug via INTRAVENOUS

## 2019-10-18 MED ORDER — LIDOCAINE-EPINEPHRINE 0.5 %-1:200000 IJ SOLN
INTRAMUSCULAR | Status: AC
  Filled 2019-10-18: qty 1

## 2019-10-18 SURGICAL SUPPLY — 36 items
ADH SKN CLS APL DERMABOND .7 (GAUZE/BANDAGES/DRESSINGS) ×1
BALL CTTN LRG ABS STRL LF (GAUZE/BANDAGES/DRESSINGS) ×1
CANISTER SUCT 3000ML PPV (MISCELLANEOUS) ×2 IMPLANT
CNTNR URN SCR LID CUP LEK RST (MISCELLANEOUS) ×1 IMPLANT
CONT SPEC 4OZ STRL OR WHT (MISCELLANEOUS) ×2
COTTONBALL LRG STERILE PKG (GAUZE/BANDAGES/DRESSINGS) ×2 IMPLANT
COVER SURGICAL LIGHT HANDLE (MISCELLANEOUS) ×2 IMPLANT
COVER WAND RF STERILE (DRAPES) ×2 IMPLANT
DECANTER SPIKE VIAL GLASS SM (MISCELLANEOUS) ×2 IMPLANT
DERMABOND ADVANCED (GAUZE/BANDAGES/DRESSINGS) ×1
DERMABOND ADVANCED .7 DNX12 (GAUZE/BANDAGES/DRESSINGS) ×1 IMPLANT
DRAPE BRACHIAL (DRAPES) ×2 IMPLANT
DRAPE HALF SHEET 40X57 (DRAPES) ×2 IMPLANT
ELECT REM PT RETURN 9FT ADLT (ELECTROSURGICAL) ×2
ELECTRODE REM PT RTRN 9FT ADLT (ELECTROSURGICAL) ×1 IMPLANT
GLOVE SS BIOGEL STRL SZ 7.5 (GLOVE) ×1 IMPLANT
GLOVE SUPERSENSE BIOGEL SZ 7.5 (GLOVE) ×1
GOWN STRL REUS W/ TWL LRG LVL3 (GOWN DISPOSABLE) ×3 IMPLANT
GOWN STRL REUS W/TWL LRG LVL3 (GOWN DISPOSABLE) ×6
KIT BASIN OR (CUSTOM PROCEDURE TRAY) ×2 IMPLANT
KIT TURNOVER KIT B (KITS) ×2 IMPLANT
LOOP VESSEL MAXI BLUE (MISCELLANEOUS) ×2 IMPLANT
NDL HYPO 25GX1X1/2 BEV (NEEDLE) ×1 IMPLANT
NEEDLE HYPO 25GX1X1/2 BEV (NEEDLE) ×2 IMPLANT
NS IRRIG 1000ML POUR BTL (IV SOLUTION) ×2 IMPLANT
PACK GENERAL/GYN (CUSTOM PROCEDURE TRAY) ×2 IMPLANT
PAD ARMBOARD 7.5X6 YLW CONV (MISCELLANEOUS) ×4 IMPLANT
SPONGE LAP 4X18 RFD (DISPOSABLE) ×2 IMPLANT
SUT SILK 3 0 (SUTURE) ×2
SUT SILK 3-0 18XBRD TIE 12 (SUTURE) ×1 IMPLANT
SUT VIC AB 3-0 SH 27 (SUTURE) ×2
SUT VIC AB 3-0 SH 27X BRD (SUTURE) ×1 IMPLANT
SUT VICRYL 4-0 PS2 18IN ABS (SUTURE) ×2 IMPLANT
SYR CONTROL 10ML LL (SYRINGE) ×2 IMPLANT
TOWEL GREEN STERILE (TOWEL DISPOSABLE) ×2 IMPLANT
WATER STERILE IRR 1000ML POUR (IV SOLUTION) IMPLANT

## 2019-10-18 NOTE — Interval H&P Note (Signed)
History and Physical Interval Note:  10/18/2019 10:01 AM  Rose Moody  has presented today for surgery, with the diagnosis of TEMPORAL ARTERITIS.  The various methods of treatment have been discussed with the patient and family. After consideration of risks, benefits and other options for treatment, the patient has consented to  Procedure(s): BIOPSY TEMPORAL ARTERY (Left) as a surgical intervention.  The patient's history has been reviewed, patient examined, no change in status, stable for surgery.  I have reviewed the patient's chart and labs.  Questions were answered to the patient's satisfaction.     Curt Jews

## 2019-10-18 NOTE — Discharge Instructions (Signed)
Incision Care, Adult An incision is a cut that a doctor makes in your skin for surgery (for a procedure). Most times, these cuts are closed after surgery. Your cut from surgery may be closed with stitches (sutures), staples, skin glue, or skin tape (adhesive strips). You may need to return to your doctor to have stitches or staples taken out. This may happen many days or many weeks after your surgery. The cut needs to be well cared for so it does not get infected. How to care for your cut Cut care   Follow instructions from your doctor about how to take care of your cut. Make sure you: ? Wash your hands with soap and water before you change your bandage (dressing). If you cannot use soap and water, use hand sanitizer. ? Change your bandage as told by your doctor. ? Leave stitches, skin glue, or skin tape in place. They may need to stay in place for 2 weeks or longer. If tape strips get loose and curl up, you may trim the loose edges. Do not remove tape strips completely unless your doctor says it is okay.  Check your cut area every day for signs of infection. Check for: ? More redness, swelling, or pain. ? More fluid or blood. ? Warmth. ? Pus or a bad smell.  Ask your doctor how to clean the cut. This may include: ? Using mild soap and water. ? Using a clean towel to pat the cut dry after you clean it. ? Putting a cream or ointment on the cut. Do this only as told by your doctor. ? Covering the cut with a clean bandage.  Ask your doctor when you can leave the cut uncovered.  Do not take baths, swim, or use a hot tub until your doctor says it is okay. Ask your doctor if you can take showers. You may only be allowed to take sponge baths for bathing. Medicines  If you were prescribed an antibiotic medicine, cream, or ointment, take the antibiotic or put it on the cut as told by your doctor. Do not stop taking or putting on the antibiotic even if your condition gets better.  Take  over-the-counter and prescription medicines only as told by your doctor. General instructions  Limit movement around your cut. This helps healing. ? Avoid straining, lifting, or exercise for the first month, or for as long as told by your doctor. ? Follow instructions from your doctor about going back to your normal activities. ? Ask your doctor what activities are safe.  Protect your cut from the sun when you are outside for the first 6 months, or for as long as told by your doctor. Put on sunscreen around the scar or cover up the scar.  Keep all follow-up visits as told by your doctor. This is important. Contact a doctor if:  Your have more redness, swelling, or pain around the cut.  You have more fluid or blood coming from the cut.  Your cut feels warm to the touch.  You have pus or a bad smell coming from the cut.  You have a fever or shaking chills.  You feel sick to your stomach (nauseous) or you throw up (vomit).  You are dizzy.  Your stitches or staples come undone. Get help right away if:  You have a red streak coming from your cut.  Your cut bleeds through the bandage and the bleeding does not stop with gentle pressure.  The edges of your cut   open up and separate.  You have very bad (severe) pain.  You have a rash.  You are confused.  You pass out (faint).  You have trouble breathing and you have a fast heartbeat. This information is not intended to replace advice given to you by your health care provider. Make sure you discuss any questions you have with your health care provider. Document Revised: 12/21/2016 Document Reviewed: 04/10/2016 Elsevier Patient Education  2020 [employer]  

## 2019-10-18 NOTE — Transfer of Care (Signed)
Immediate Anesthesia Transfer of Care Note  Patient: Rose Moody  Procedure(s) Performed: BIOPSY TEMPORAL ARTERY LEFT (Left )  Patient Location: PACU  Anesthesia Type:MAC  Level of Consciousness: awake, alert  and oriented  Airway & Oxygen Therapy: Patient Spontanous Breathing and Patient connected to nasal cannula oxygen  Post-op Assessment: Report given to RN, Post -op Vital signs reviewed and stable and Patient moving all extremities X 4  Post vital signs: Reviewed and stable  Last Vitals:  Vitals Value Taken Time  BP 133/63 10/18/19 1112  Temp    Pulse 62 10/18/19 1114  Resp 17 10/18/19 1114  SpO2 100 % 10/18/19 1114  Vitals shown include unvalidated device data.  Last Pain:  Vitals:   10/18/19 0903  TempSrc:   PainSc: 0-No pain      Patients Stated Pain Goal: 3 (23/53/61 4431)  Complications: No apparent anesthesia complications

## 2019-10-18 NOTE — Anesthesia Postprocedure Evaluation (Signed)
Anesthesia Post Note  Patient: Rose Moody  Procedure(s) Performed: BIOPSY TEMPORAL ARTERY LEFT (Left )     Anesthesia Post Evaluation  Last Vitals:  Vitals:   10/18/19 1110 10/18/19 1125  BP: 133/63 133/63  Pulse: 63 (!) 56  Resp: 12 13  Temp: 36.9 C 36.8 C  SpO2: 92% 99%    Last Pain:  Vitals:   10/18/19 1110  TempSrc:   PainSc: 0-No pain                 Nakoa Ganus

## 2019-10-18 NOTE — Anesthesia Postprocedure Evaluation (Signed)
Anesthesia Post Note  Patient: Rose Moody  Procedure(s) Performed: BIOPSY TEMPORAL ARTERY LEFT (Left )     Anesthesia Type: MAC Level of consciousness: awake Pain management: pain level controlled Vital Signs Assessment: post-procedure vital signs reviewed and stable Respiratory status: spontaneous breathing Postop Assessment: no apparent nausea or vomiting Anesthetic complications: no    Last Vitals:  Vitals:   10/18/19 0908 10/18/19 1110  BP: (!) 181/86 (P) 133/63  Pulse:  (P) 63  Resp:  (P) 12  Temp:  (P) 36.9 C  SpO2:  (P) 92%    Last Pain:  Vitals:   10/18/19 0903  TempSrc:   PainSc: 0-No pain                 Ronetta Molla

## 2019-10-18 NOTE — Op Note (Signed)
    OPERATIVE REPORT  DATE OF SURGERY: 10/18/2019  PATIENT: Rose Moody, 79 y.o. female MRN: [id number]  DOB: 1940-08-28  PRE-OPERATIVE DIAGNOSIS: Visual changes with possible temporal arteritis  POST-OPERATIVE DIAGNOSIS:  Same  PROCEDURE: Left temporal artery biopsy  SURGEON:  Curt Jews, M.D.  PHYSICIAN ASSISTANT: Nurse  ANESTHESIA: Local with sedation  EBL: per anesthesia record  Total I/O In: 500 [I.V.:500] Out: 15 [Blood:15]  BLOOD ADMINISTERED: none  DRAINS: none  SPECIMEN: none  COUNTS CORRECT:  YES  PATIENT DISPOSITION:  PACU - hemodynamically stable  PROCEDURE DETAILS: Patient was taken up and placed supine position with area of the left preauricular area was prepped and draped in usual sterile fashion.  Using local anesthesia, an incision was made anterior to the ear and carried down to the level of the temporal artery.  The artery was mobilized for approximately 2 cm.  The artery was ligated proximally and distally with 3-0 silk ties and divided.  The specimen was sent for permanent section.  The wound was irrigated with saline and hemostasis to electrocautery.  The deep tissue was closed with 3-0 Vicryl interrupted sutures and the skin was closed with 4-0 Monocryl suture.  Dermabond was applied and the patient was transferred to the recovery room in stable condition   Rosetta Posner, M.D., [facility] 10/18/2019 11:19 AM

## 2019-10-18 NOTE — Anesthesia Preprocedure Evaluation (Signed)
Anesthesia Evaluation  Patient identified by MRN, date of birth, ID band Patient awake    Reviewed: Allergy & Precautions, NPO status , Patient's Chart, lab work & pertinent test results  History of Anesthesia Complications (+) Family history of anesthesia reaction  Airway Mallampati: II  TM Distance: >3 FB     Dental   Pulmonary pneumonia,    breath sounds clear to auscultation       Cardiovascular hypertension, + Peripheral Vascular Disease   Rhythm:Regular Rate:Normal     Neuro/Psych    GI/Hepatic PUD, GERD  ,  Endo/Other  Hypothyroidism   Renal/GU Renal disease     Musculoskeletal   Abdominal   Peds  Hematology  (+) anemia ,   Anesthesia Other Findings   Reproductive/Obstetrics                             Anesthesia Physical Anesthesia Plan  ASA: III  Anesthesia Plan: MAC   Post-op Pain Management:    Induction: Intravenous  PONV Risk Score and Plan: 2 and Ondansetron, Dexamethasone and Midazolam  Airway Management Planned: Nasal Cannula and Simple Face Mask  Additional Equipment:   Intra-op Plan:   Post-operative Plan:   Informed Consent: I have reviewed the patients History and Physical, chart, labs and discussed the procedure including the risks, benefits and alternatives for the proposed anesthesia with the patient or authorized representative who has indicated his/her understanding and acceptance.     History available from chart only and Dental advisory given  Plan Discussed with: Anesthesiologist and CRNA  Anesthesia Plan Comments:         Anesthesia Quick Evaluation

## 2019-10-19 LAB — SURGICAL PATHOLOGY

## 2019-10-20 ENCOUNTER — Telehealth: Payer: Self-pay

## 2019-10-20 NOTE — Telephone Encounter (Signed)
Pt called an notified that her temporal artery biopsy was negative per Dr Donnetta Hutching.   York Cerise, CMA

## 2019-10-23 DIAGNOSIS — M353 Polymyalgia rheumatica: Secondary | ICD-10-CM | POA: Diagnosis not present

## 2019-10-23 DIAGNOSIS — D510 Vitamin B12 deficiency anemia due to intrinsic factor deficiency: Secondary | ICD-10-CM | POA: Diagnosis not present

## 2019-10-23 DIAGNOSIS — E878 Other disorders of electrolyte and fluid balance, not elsewhere classified: Secondary | ICD-10-CM | POA: Diagnosis not present

## 2019-10-25 ENCOUNTER — Other Ambulatory Visit (HOSPITAL_COMMUNITY): Payer: Self-pay | Admitting: Family Medicine

## 2019-10-25 DIAGNOSIS — H547 Unspecified visual loss: Secondary | ICD-10-CM

## 2019-10-31 ENCOUNTER — Other Ambulatory Visit: Payer: Self-pay

## 2019-10-31 ENCOUNTER — Ambulatory Visit (HOSPITAL_COMMUNITY)
Admission: RE | Admit: 2019-10-31 | Discharge: 2019-10-31 | Disposition: A | Discharge status: Home / Self Care | Payer: PPO | Source: Ambulatory Visit | Attending: Family Medicine | Admitting: Family Medicine

## 2019-10-31 ENCOUNTER — Ambulatory Visit (HOSPITAL_BASED_OUTPATIENT_CLINIC_OR_DEPARTMENT_OTHER)
Admission: RE | Admit: 2019-10-31 | Discharge: 2019-10-31 | Disposition: A | Discharge status: Home / Self Care | Payer: PPO | Source: Ambulatory Visit | Attending: Family Medicine | Admitting: Family Medicine

## 2019-10-31 DIAGNOSIS — I10 Essential (primary) hypertension: Secondary | ICD-10-CM | POA: Insufficient documentation

## 2019-10-31 DIAGNOSIS — H547 Unspecified visual loss: Secondary | ICD-10-CM

## 2019-10-31 NOTE — Progress Notes (Signed)
Carotid artery duplex completed. Refer to "CV Proc" under chart review to view preliminary results.  10/31/2019 4:13 PM Kelby Aline., MHA, RVT, RDCS, RDMS

## 2019-10-31 NOTE — Progress Notes (Signed)
  Echocardiogram 2D Echocardiogram has been performed.  Rose Moody 10/31/2019, 3:54 PM

## 2019-11-02 ENCOUNTER — Other Ambulatory Visit: Payer: Self-pay | Admitting: Interventional Cardiology

## 2019-11-02 DIAGNOSIS — R0600 Dyspnea, unspecified: Secondary | ICD-10-CM

## 2019-11-02 DIAGNOSIS — I10 Essential (primary) hypertension: Secondary | ICD-10-CM

## 2019-11-02 DIAGNOSIS — R531 Weakness: Secondary | ICD-10-CM

## 2019-11-02 DIAGNOSIS — Z8249 Family history of ischemic heart disease and other diseases of the circulatory system: Secondary | ICD-10-CM

## 2019-11-02 DIAGNOSIS — R0609 Other forms of dyspnea: Secondary | ICD-10-CM

## 2019-11-08 DIAGNOSIS — S92325A Nondisplaced fracture of second metatarsal bone, left foot, initial encounter for closed fracture: Secondary | ICD-10-CM | POA: Diagnosis not present

## 2019-11-09 DIAGNOSIS — E878 Other disorders of electrolyte and fluid balance, not elsewhere classified: Secondary | ICD-10-CM | POA: Diagnosis not present

## 2019-11-13 DIAGNOSIS — E039 Hypothyroidism, unspecified: Secondary | ICD-10-CM | POA: Diagnosis not present

## 2019-11-13 DIAGNOSIS — Z8349 Family history of other endocrine, nutritional and metabolic diseases: Secondary | ICD-10-CM | POA: Diagnosis not present

## 2019-11-13 DIAGNOSIS — Z7189 Other specified counseling: Secondary | ICD-10-CM | POA: Diagnosis not present

## 2019-11-15 DIAGNOSIS — I739 Peripheral vascular disease, unspecified: Secondary | ICD-10-CM | POA: Diagnosis not present

## 2019-11-15 DIAGNOSIS — M84375A Stress fracture, left foot, initial encounter for fracture: Secondary | ICD-10-CM | POA: Diagnosis not present

## 2019-11-15 DIAGNOSIS — L602 Onychogryphosis: Secondary | ICD-10-CM | POA: Diagnosis not present

## 2019-11-21 DIAGNOSIS — E878 Other disorders of electrolyte and fluid balance, not elsewhere classified: Secondary | ICD-10-CM | POA: Diagnosis not present

## 2019-11-21 DIAGNOSIS — Z8349 Family history of other endocrine, nutritional and metabolic diseases: Secondary | ICD-10-CM | POA: Diagnosis not present

## 2019-11-21 DIAGNOSIS — E039 Hypothyroidism, unspecified: Secondary | ICD-10-CM | POA: Diagnosis not present

## 2019-11-21 DIAGNOSIS — Z7189 Other specified counseling: Secondary | ICD-10-CM | POA: Diagnosis not present

## 2019-11-21 DIAGNOSIS — D510 Vitamin B12 deficiency anemia due to intrinsic factor deficiency: Secondary | ICD-10-CM | POA: Diagnosis not present

## 2019-11-26 NOTE — Progress Notes (Signed)
Cardiology Office Note   Date:  11/27/2019   ID:  Rose Moody, DOB [birth year], MRN [id number]  PCP:  Leighton Ruff, MD    No chief complaint on file.  Hypertension  Wt Readings from Last 3 Encounters:  11/27/19 159 lb (72.1 kg)  10/18/19 159 lb 8 oz (72.3 kg)  10/10/19 159 lb 8 oz (72.3 kg)       History of Present Illness: Rose Moody is a 79 y.o. female   With HTN. Was thought to have renal artery stenosis by duplex, but then angiogram was performed in June 2013.There was no evidence of renal artery stenosis. She did have dual arterial supply to both kidneys.  Retired from [employer].  BP was well controlled in May 2019.    After that, she has had high readings at home.  Valsartan was increased from 80 mg to 160 mg.  In 2020, it was noted: "SHe has had some fatigue/DOE as well. She reports weakness with minimal exertion.  She can feel weak when she raises her arms up in the shower to wash her hair."    Temporal artery biopsy was negative in 2021.  Despite this, rheumatologist felt that she could still have temporal arteritis.  She had a fall in 2/21 where she broke her foot.  It was after the shower.  She stays hydrated with water.    She checks her BP at home and will skip her valsartan if it is "low".  I reviewed her home readings.  She has several readings that are in the 0000000 systolic.     Denies : Chest pain. Dizziness.  Nitroglycerin use. Orthopnea. Palpitations. Paroxysmal nocturnal dyspnea. Shortness of breath. Syncope.   Prednisone has been started at high dose and has been tapered, for PMR. Thsi has likely affected her BP as well.   Past Medical History:  Diagnosis Date  . Anemia   . Anxiety    has had claustrophobia  . Atherosclerosis of renal artery ([place])   . Chronic kidney disease    Stage 3   . Detached retina   . Family history of adverse reaction to anesthesia    sister has trouble waking up  . Fibromyalgia   . GERD  (gastroesophageal reflux disease)   . Headache    in her 40's, none now  . Hypertension   . Hypothyroidism   . Incontinence   . Macular degeneration   . Osteoarthritis    knees and back  . Pernicious anemia   . Pneumonia    walking pneumonia  . Stomach ulcer    in her 24s  . Thyroid disease   . UTI (lower urinary tract infection)   . Varicose veins   . Vitamin D deficiency     Past Surgical History:  Procedure Laterality Date  . ABDOMINAL HYSTERECTOMY  age 59  . APPENDECTOMY  age 79  . ARTERY BIOPSY Left 10/18/2019   Procedure: BIOPSY TEMPORAL ARTERY LEFT;  Surgeon: Rosetta Posner, MD;  Location: [facility];  Service: Vascular;  Laterality: Left;  . BLADDER SUSPENSION  age 44  . COLONOSCOPY    . ENDOVENOUS ABLATION SAPHENOUS VEIN W/ LASER Left 01-16-2016   endovenous laser ablation left small saphenous vein by Curt Jews MD  . laser surgery for retinal detachment Left 2016   Dr. Zigmund Daniel  . RENAL ANGIOGRAM N/A 01/28/2012   Procedure: RENAL ANGIOGRAM;  Surgeon: Jettie Booze, MD;  [facility];  Service: Cardiovascular;  Laterality: N/A;  . stab phlebectomy Right 12-08-2012   > 20 incisions by Curt Jews MD     Current Outpatient Medications  Medication Sig Dispense Refill  . Calcium Carbonate-Vitamin D (CALCIUM + D PO) Take 1 tablet by mouth 2 (two) times daily. Reported on 01/16/2016    . cephALEXin (KEFLEX) 250 MG capsule Take 250 mg by mouth every evening.    . cyanocobalamin (,VITAMIN B-12,) 1000 MCG/ML injection Inject 1,000 mcg into the muscle every 30 (thirty) days. Given in doctor's office     . diphenhydrAMINE (BENADRYL) 25 mg capsule Take 50 mg by mouth at bedtime.     . dorzolamide-timolol (COSOPT) 22.3-6.8 MG/ML ophthalmic solution 1 drop 2 (two) times daily.    Marland Kitchen estradiol (ESTRACE) 0.1 MG/GM vaginal cream Place 1 g vaginally once a week. Sundays    . fluticasone (FLONASE) 50 MCG/ACT nasal spray Place into both nostrils as needed for allergies or  rhinitis.    Marland Kitchen ipratropium (ATROVENT) 0.06 % nasal spray Place 2-3 sprays into both nostrils daily.     Marland Kitchen latanoprost (XALATAN) 0.005 % ophthalmic solution 1 drop at bedtime.    . Multiple Vitamins-Minerals (CENTRUM SILVER ULTRA WOMENS) TABS Take 1 tablet by mouth every morning.     . Multiple Vitamins-Minerals (PRESERVISION/LUTEIN PO) Take 1 tablet by mouth 2 (two) times daily.    Marland Kitchen omeprazole (PRILOSEC) 20 MG capsule Take 20 mg by mouth daily.    Vladimir Faster Glycol-Propyl Glycol (SYSTANE ULTRA OP) Apply 1 drop to eye daily as needed.    . predniSONE (DELTASONE) 10 MG tablet Take 40 mg by mouth daily.    . sucralfate (CARAFATE) 1 g tablet Take 1 g by mouth 2 (two) times daily.     Marland Kitchen SYNTHROID 100 MCG tablet Take 100 mcg by mouth daily before breakfast.     . valsartan (DIOVAN) 80 MG tablet Take 1 tablet (80 mg total) by mouth 2 (two) times daily. Please keep upcoming appt in April with Dr. Irish Lack for future refills. Thank you 60 tablet 0   No current facility-administered medications for this visit.   Facility-Administered Medications Ordered in Other Visits  Medication Dose Route Frequency Provider Last Rate Last Admin  . diphenhydrAMINE (BENADRYL) injection 25 mg  25 mg Intravenous Once Jettie Booze, MD      . famotidine (PEPCID) 20 mg in sodium chloride 0.9 % 50 mL IVPB  20 mg Intravenous Once Jettie Booze, MD        Allergies:   Trazodone and nefazodone, Minoxidil, Codeine, Contrast media [iodinated diagnostic agents], Cortisone, Dyclonine, Fesoterodine, Lisinopril, Spironolactone, Tequila sunrise flavor, Toviaz [fesoterodine fumarate], Astelin, Azelastine, Azithromycin, Cefuroxime axetil, Ciprofloxacin, Metrizamide, Ondansetron, Zithromax [azithromycin dihydrate], and Zofran    Social History:  The patient  reports that she has never smoked. She has never used smokeless tobacco. She reports that she does not drink alcohol or use drugs.   Family History:  The patient's  family history includes Bone cancer in her brother; Cancer in her mother; Congestive Heart Failure in her sister; Heart disease in her brother, brother, father, and sister; Melanoma in her mother; Pneumonia in her sister and sister; Rheum arthritis in her brother; Stroke in her mother.    ROS:  Please see the history of present illness.   Otherwise, review of systems are positive for arm weakness related to PMR.   All other systems are reviewed and negative.    PHYSICAL EXAM: VS:  BP (!) 150/68  Pulse 72   Ht 5' 2.5" (1.588 m)   Wt 159 lb (72.1 kg)   SpO2 97%   BMI 28.62 kg/m  , BMI Body mass index is 28.62 kg/m. GEN: Well nourished, well developed, in no acute distress  HEENT: normal  Neck: no JVD, carotid bruits, or masses Cardiac: RRR; no murmurs, rubs, or gallops,no edema  Respiratory:  clear to auscultation bilaterally, normal work of breathing GI: soft, nontender, nondistended, + BS MS: no deformity or atrophy ; 2+ DP pulses bilaterally Skin: warm and dry, no rash Neuro:  Strength and sensation are intact Psych: euthymic mood, full affect   EKG:   The ekg ordered in 2/21 demonstrates NSR, nonspecific ST changes   Recent Labs: 10/05/2019: ALT 16; BUN 30; Creatinine, Ser 1.26; Hemoglobin 11.6; Platelets 333; Potassium 3.9; Sodium 138   Lipid Panel No results found for: CHOL, TRIG, HDL, CHOLHDL, VLDL, LDLCALC, LDLDIRECT   Other studies Reviewed: Additional studies/ records that were reviewed today with results demonstrating: ER records reviewed.   ASSESSMENT AND PLAN:  1. Hypertension: Hold valsartan if systolic is < [id number] when she checks at home.  BP has fluctuated with PMR and prednisone.  SHe will change to valsartan 80 mg in AM and 40 mg in the evening. 2. Dyspnea on exertion/fatigue: Exercise/ walking Limited by broken foot.  3. She underwent temporal artery biopsy due to the weakness in her arms that she reported last year.   4. Venous insufficiency: was treated  for varicose veins.  Elevate legs, compression stockings.    Current medicines are reviewed at length with the patient today.  The patient concerns regarding her medicines were addressed.  The following changes have been made:  Valsartan as above fibromyalgia it is difficult to kind of pain down  Labs/ tests ordered today include:  No orders of the defined types were placed in this encounter.   Recommend 150 minutes/week of aerobic exercise Low fat, low carb, high fiber diet recommended  Disposition:   FU 3-4 weeks with PharmD HTN clinic   Signed, Larae Grooms, MD  11/27/2019 2:20 PM    [address], [place], [place]  [address] Phone: [phone]; Fax: [phone]

## 2019-11-27 ENCOUNTER — Encounter: Payer: Self-pay | Admitting: Interventional Cardiology

## 2019-11-27 ENCOUNTER — Other Ambulatory Visit: Payer: Self-pay

## 2019-11-27 ENCOUNTER — Ambulatory Visit: Payer: PPO | Admitting: Interventional Cardiology

## 2019-11-27 VITALS — BP 150/68 | HR 72 | Ht 62.5 in | Wt 159.0 lb

## 2019-11-27 DIAGNOSIS — R0600 Dyspnea, unspecified: Secondary | ICD-10-CM

## 2019-11-27 DIAGNOSIS — I10 Essential (primary) hypertension: Secondary | ICD-10-CM | POA: Diagnosis not present

## 2019-11-27 DIAGNOSIS — R0609 Other forms of dyspnea: Secondary | ICD-10-CM

## 2019-11-27 DIAGNOSIS — Z8249 Family history of ischemic heart disease and other diseases of the circulatory system: Secondary | ICD-10-CM | POA: Diagnosis not present

## 2019-11-27 DIAGNOSIS — E039 Hypothyroidism, unspecified: Secondary | ICD-10-CM | POA: Diagnosis not present

## 2019-11-27 DIAGNOSIS — R531 Weakness: Secondary | ICD-10-CM | POA: Diagnosis not present

## 2019-11-27 MED ORDER — VALSARTAN 80 MG PO TABS: ORAL_TABLET | ORAL | 3 refills | Status: DC

## 2019-11-27 NOTE — Patient Instructions (Addendum)
Medication Instructions:  Your physician has recommended you make the following change in your medication:   CHANGE: valsartan 80 mg tablet: Take 1 tablet (80 mg) in the morning and 1/2 tablet (40 mg) in the evening. If your systolic blood pressure (top number) is less than 110, hold your evening dose.  *If you need a refill on your cardiac medications before your next appointment, please call your pharmacy*   Lab Work: None ordered  If you have labs (blood work) drawn today and your tests are completely normal, you will receive your results only by: Marland Kitchen MyChart Message (if you have MyChart) OR . A paper copy in the mail If you have any lab test that is abnormal or we need to change your treatment, we will call you to review the results.   Testing/Procedures: None ordered   Follow-Up: Follow up in the Hypertension Clinic on 12/18/19 at 1:30 PM   At [facility], you and your health needs are our priority.  As part of our continuing mission to provide you with exceptional heart care, we have created designated Provider Care Teams.  These Care Teams include your primary Cardiologist (physician) and Advanced Practice Providers (APPs -  Physician Assistants and Nurse Practitioners) who all work together to provide you with the care you need, when you need it.  We recommend signing up for the patient portal called "MyChart".  Sign up information is provided on this After Visit Summary.  MyChart is used to connect with patients for Virtual Visits (Telemedicine).  Patients are able to view lab/test results, encounter notes, upcoming appointments, etc.  Non-urgent messages can be sent to your provider as well.   To learn more about what you can do with MyChart, go to [email or web address].    Your next appointment:   12 month(s)  The format for your next appointment:   In Person  Provider:   You may see Larae Grooms, MD or one of the following Advanced Practice Providers on your  designated Care Team:    Melina Copa, PA-C  Ermalinda Barrios, PA-C    Other Instructions

## 2019-11-28 ENCOUNTER — Other Ambulatory Visit: Payer: Self-pay | Admitting: Interventional Cardiology

## 2019-11-28 DIAGNOSIS — R531 Weakness: Secondary | ICD-10-CM

## 2019-11-28 DIAGNOSIS — R0600 Dyspnea, unspecified: Secondary | ICD-10-CM

## 2019-11-28 DIAGNOSIS — I10 Essential (primary) hypertension: Secondary | ICD-10-CM

## 2019-11-28 DIAGNOSIS — Z8249 Family history of ischemic heart disease and other diseases of the circulatory system: Secondary | ICD-10-CM

## 2019-11-28 DIAGNOSIS — R0609 Other forms of dyspnea: Secondary | ICD-10-CM

## 2019-12-06 DIAGNOSIS — S92325D Nondisplaced fracture of second metatarsal bone, left foot, subsequent encounter for fracture with routine healing: Secondary | ICD-10-CM | POA: Diagnosis not present

## 2019-12-14 ENCOUNTER — Telehealth: Payer: Self-pay | Admitting: Interventional Cardiology

## 2019-12-14 NOTE — Telephone Encounter (Signed)
Called and spoke to patient. She states that she has been on prednisone and thinks that that is why her BP was elevated. She has tapered down to 10 mg QD. She is scheduled in the HTN Clinic on Monday and is wondering if she should keep this appt or not. She states that she will be on prednisone until the end of May. She reports SBPs at hime have been 130-150s. Per Dr. Irish Lack, the patient should keep her appt with the HTN Clinic. Patient verbalized understanding.

## 2019-12-14 NOTE — Telephone Encounter (Signed)
  Pt c/o medication issue:  1. Name of Medication: predniSONE (DELTASONE) 10 MG tablet  2. How are you currently taking this medication (dosage and times per day)? 10 mg   3. Are you having a reaction (difficulty breathing--STAT)?   4. What is your medication issue? Pt has been taking Prednisone since Early February She started taking 40 mg daily but has slowly tapered down to 10 mg. . She will be taking the medication through the end of May. She said the side effects of this medication is  elevated BP. She wanted to know if that could have been the reason why her BP was high at her last visit with Dr. Irish Lack.   She wanted to know if she should wait to come to the HTN Clinic until after she finishes her Prednisone regimen or if she should still come in on Monday

## 2019-12-18 ENCOUNTER — Ambulatory Visit (INDEPENDENT_AMBULATORY_CARE_PROVIDER_SITE_OTHER): Payer: PPO | Admitting: Pharmacist

## 2019-12-18 ENCOUNTER — Other Ambulatory Visit: Payer: Self-pay

## 2019-12-18 VITALS — BP 152/70 | HR 76

## 2019-12-18 DIAGNOSIS — R0600 Dyspnea, unspecified: Secondary | ICD-10-CM

## 2019-12-18 DIAGNOSIS — I10 Essential (primary) hypertension: Secondary | ICD-10-CM

## 2019-12-18 DIAGNOSIS — R0609 Other forms of dyspnea: Secondary | ICD-10-CM

## 2019-12-18 DIAGNOSIS — R531 Weakness: Secondary | ICD-10-CM | POA: Diagnosis not present

## 2019-12-18 DIAGNOSIS — Z8249 Family history of ischemic heart disease and other diseases of the circulatory system: Secondary | ICD-10-CM

## 2019-12-18 MED ORDER — VALSARTAN 80 MG PO TABS: 80.0000 mg | ORAL_TABLET | Freq: Two times a day (BID) | ORAL | 3 refills | Status: DC

## 2019-12-18 NOTE — Patient Instructions (Addendum)
It was great meeting you today!  Your blood pressure goal is less than 130/80.  Please increase your valsartan to 80 mg twice daily.  If your systolic blood pressure (the top number) is less than 110 in the evening, do not take your evening dose of valsartan.  Continue to monitor your blood pressure at home.    Please call us with any questions or with an update on your blood pressure readings at [phone]

## 2019-12-18 NOTE — Progress Notes (Signed)
Patient ID: Rose Moody                 DOB: 1941/06/06                      MRN: [id number]     HPI: Rose Moody is a 79 y.o. female referred by Dr. Irish Lack to HTN clinic. PMH is significant for hypothyroidism, dyspnea on exertion, and polymyalgia rheumatica treated with tapering prednisone.  Currently taking prednisone 10mg  daily and will continue throughout May. Patient concerned this could be elevating her blood pressure.  Has family history of CAD. Has occasional hypotension and will skip valsartan if BP low at home.  Fell in 2/21 after shower and broke her foot.  Had renal angiogram in 2013, no evidence of renal artery stenosis.   Temporal artery biopsy was negative in 2021.  Despite this, rheumatologist felt that she could still have temporal arteritis.  Has allergies/intolerances to Minoxidil, amlodipine, spironolactone, and lisinopril.  This is her first visit to the HTN clinic.   Seen by Dr. Irish Lack on 11/27/19.  BP/150/68 at time of visit. Valsartan was decreased to 80mg  in morning, 40 in evening if systolic BP [id number] in the evening.  Rose Moody is a pleasant 79 year old female who presents to HTN clinic for first visit.  Is currently managed on just valsartan 80 mg in morning and 40 mg at night, if evening systolic BP is greater than 110.  Reports that she had to skip her PM dose one time.  Broke foot 11 weeks ago and has not healed properly which limits her ability to exercise.  Reports weight gain and eating more since starting on prednisone one month ago. Believes she has gained 20 pounds.  Currently on prednisone 10 mg once daily, has rheumatology appointment tomorrow.   Denies headaches, blurred vision, no other falls.  Added a bench in her shower to for assistance with bathing and washing hair.    Current HTN meds: Valsartan 80 mg in morning, 40 mg at night.  Holds evening dose if systolic BP < [id number] in the evening.  Previously tried: Amlodipine 5 mg (2013 - hair fell out), Losartan 100mg   (2013), Minoxidil (feet swelling), spironolactone (full body rash).    BP goal: <130/80  Family History: The patient's family history includes Bone cancer in her brother; Cancer in her mother; Congestive Heart Failure in her sister; Heart disease in her brother, brother, father, and sister; Melanoma in her mother; Pneumonia in her sister and sister; Rheum arthritis in her brother; Stroke in her mother.   Social History: The patient  reports that she has never smoked. She has never used smokeless tobacco. She reports that she does not drink alcohol or use drugs.    Diet: Does not add salt to food.  Does not drink caffeine.    Exercise: Limited due to broken foot.  Was previously walking 30 minutes a day at least 5 days a week.  Is having trouble doing housework now as well  Home BP readings: 132/69, 126/66, 140/72, 141/71, 149/76, 132/78, 118/68, 125/68, 152/70, 146/73, 136/73.  Is checking BP about 3 times per day.  Received new BP cuff last December, measures on her bicep.  Wt Readings from Last 3 Encounters:  11/27/19 159 lb (72.1 kg)  10/18/19 159 lb 8 oz (72.3 kg)  10/10/19 159 lb 8 oz (72.3 kg)   BP Readings from Last 3 Encounters:  11/27/19 (!) 150/68  10/18/19 (!) 149/82  10/10/19 (!) 145/75   Pulse Readings from Last 3 Encounters:  11/27/19 72  10/18/19 (!) 59  10/10/19 68    Renal function: CrCl cannot be calculated (Patient's most recent lab result is older than the maximum 21 days allowed.).  Scr 1.06 on 11/21/19.  Past Medical History:  Diagnosis Date  . Anemia   . Anxiety    has had claustrophobia  . Atherosclerosis of renal artery ([place])   . Chronic kidney disease    Stage 3   . Detached retina   . Family history of adverse reaction to anesthesia    sister has trouble waking up  . Fibromyalgia   . GERD (gastroesophageal reflux disease)   . Headache    in her 40's, none now  . Hypertension   . Hypothyroidism   . Incontinence   . Macular degeneration   .  Osteoarthritis    knees and back  . Pernicious anemia   . Pneumonia    walking pneumonia  . Stomach ulcer    in her 54s  . Thyroid disease   . UTI (lower urinary tract infection)   . Varicose veins   . Vitamin D deficiency     Current Outpatient Medications on File Prior to Visit  Medication Sig Dispense Refill  . Calcium Carbonate-Vitamin D (CALCIUM + D PO) Take 1 tablet by mouth 2 (two) times daily. Reported on 01/16/2016    . cephALEXin (KEFLEX) 250 MG capsule Take 250 mg by mouth every evening.    . cyanocobalamin (,VITAMIN B-12,) 1000 MCG/ML injection Inject 1,000 mcg into the muscle every 30 (thirty) days. Given in doctor's office     . diphenhydrAMINE (BENADRYL) 25 mg capsule Take 50 mg by mouth at bedtime.     . dorzolamide-timolol (COSOPT) 22.3-6.8 MG/ML ophthalmic solution 1 drop 2 (two) times daily.    Marland Kitchen estradiol (ESTRACE) 0.1 MG/GM vaginal cream Place 1 g vaginally once a week. Sundays    . fluticasone (FLONASE) 50 MCG/ACT nasal spray Place into both nostrils as needed for allergies or rhinitis.    Marland Kitchen ipratropium (ATROVENT) 0.06 % nasal spray Place 2-3 sprays into both nostrils daily.     Marland Kitchen latanoprost (XALATAN) 0.005 % ophthalmic solution 1 drop at bedtime.    . Multiple Vitamins-Minerals (CENTRUM SILVER ULTRA WOMENS) TABS Take 1 tablet by mouth every morning.     . Multiple Vitamins-Minerals (PRESERVISION/LUTEIN PO) Take 1 tablet by mouth 2 (two) times daily.    Marland Kitchen omeprazole (PRILOSEC) 20 MG capsule Take 20 mg by mouth daily.    Vladimir Faster Glycol-Propyl Glycol (SYSTANE ULTRA OP) Apply 1 drop to eye daily as needed.    . predniSONE (DELTASONE) 10 MG tablet Take 40 mg by mouth daily.    . sucralfate (CARAFATE) 1 g tablet Take 1 g by mouth 2 (two) times daily.     Marland Kitchen SYNTHROID 100 MCG tablet Take 100 mcg by mouth daily before breakfast.     . valsartan (DIOVAN) 80 MG tablet Take 1 tablet (80 mg) in the morning and 1/2 tablet (40 mg) in the evening. If systolic blood pressure  123456 hold PM dose 135 tablet 3   Current Facility-Administered Medications on File Prior to Visit  Medication Dose Route Frequency Provider Last Rate Last Admin  . diphenhydrAMINE (BENADRYL) injection 25 mg  25 mg Intravenous Once Jettie Booze, MD      . famotidine (PEPCID) 20 mg in sodium chloride 0.9 % 50 mL IVPB  20 mg  Intravenous Once Jettie Booze, MD        Allergies  Allergen Reactions  . Trazodone And Nefazodone Other (See Comments)    Chest pain  . Minoxidil     Talaya and tachycardia  . Codeine Nausea And Vomiting  . Contrast Media [Iodinated Diagnostic Agents]     Rash - given during an eye procedure  . Cortisone Other (See Comments) and Swelling    Body swelling. Has taken other steroids Body swelling. Has taken other steroids  . Dyclonine Other (See Comments)    Pt. States she experienced a seizure after taking this medication. Pt. States she experienced a seizure after taking this medication.  . Fesoterodine Other (See Comments)    Dehydration and confusion  . Lisinopril Cough and Other (See Comments)  . Spironolactone     Full body rash  . [employer]     unknown  . Toviaz [Fesoterodine Fumarate] Other (See Comments)    Dehydration and confusion  . Astelin Rash  . Azelastine Rash  . Azithromycin Rash  . Cefuroxime Axetil Rash  . Ciprofloxacin Rash  . Metrizamide Rash    Rash - given during an eye procedure  . Ondansetron Rash  . Zithromax [Azithromycin Dihydrate] Rash  . Zofran Rash      Assessment/Plan:  1. Hypertension -  Patient's BP of 152/70 is above goal of <130/80.  Due to medication intolerance to amlodipine, spironolactone, and minoxidil, recommend increasing valsartan to 80 mg twice daily and to continue skipping evening dose if evening systolic BP [id number].  Recommended patient continue to check BP 1-2 hours after taking her medications and to call HTN clinic with update in 4 weeks.    Karren Cobble, PharmD, BCACP, [place] [id number] [place], [place] [address] Phone: [phone]; Fax: [phone] 12/18/2019 1:50 PM

## 2019-12-19 DIAGNOSIS — D510 Vitamin B12 deficiency anemia due to intrinsic factor deficiency: Secondary | ICD-10-CM | POA: Diagnosis not present

## 2019-12-19 DIAGNOSIS — M353 Polymyalgia rheumatica: Secondary | ICD-10-CM | POA: Diagnosis not present

## 2019-12-19 DIAGNOSIS — R5383 Other fatigue: Secondary | ICD-10-CM | POA: Diagnosis not present

## 2020-01-03 DIAGNOSIS — S92325D Nondisplaced fracture of second metatarsal bone, left foot, subsequent encounter for fracture with routine healing: Secondary | ICD-10-CM | POA: Diagnosis not present

## 2020-01-11 ENCOUNTER — Other Ambulatory Visit: Payer: Self-pay | Admitting: *Deleted

## 2020-01-11 ENCOUNTER — Other Ambulatory Visit: Payer: Self-pay | Admitting: Orthopaedic Surgery

## 2020-01-11 DIAGNOSIS — S92325A Nondisplaced fracture of second metatarsal bone, left foot, initial encounter for closed fracture: Secondary | ICD-10-CM

## 2020-01-11 DIAGNOSIS — I83812 Varicose veins of left lower extremities with pain: Secondary | ICD-10-CM

## 2020-01-16 ENCOUNTER — Encounter (HOSPITAL_COMMUNITY): Payer: Self-pay

## 2020-01-16 ENCOUNTER — Ambulatory Visit: Payer: PPO | Admitting: Vascular Surgery

## 2020-01-16 ENCOUNTER — Ambulatory Visit (HOSPITAL_COMMUNITY)
Admission: RE | Admit: 2020-01-16 | Discharge: 2020-01-16 | Disposition: A | Discharge status: Home / Self Care | Payer: PPO | Source: Ambulatory Visit | Attending: Vascular Surgery | Admitting: Vascular Surgery

## 2020-01-16 ENCOUNTER — Other Ambulatory Visit: Payer: Self-pay

## 2020-01-16 DIAGNOSIS — I83812 Varicose veins of left lower extremities with pain: Secondary | ICD-10-CM

## 2020-01-16 DIAGNOSIS — E559 Vitamin D deficiency, unspecified: Secondary | ICD-10-CM | POA: Diagnosis not present

## 2020-01-16 DIAGNOSIS — N183 Chronic kidney disease, stage 3 unspecified: Secondary | ICD-10-CM | POA: Diagnosis not present

## 2020-01-16 DIAGNOSIS — M353 Polymyalgia rheumatica: Secondary | ICD-10-CM | POA: Diagnosis not present

## 2020-01-16 DIAGNOSIS — E039 Hypothyroidism, unspecified: Secondary | ICD-10-CM | POA: Diagnosis not present

## 2020-01-16 DIAGNOSIS — J349 Unspecified disorder of nose and nasal sinuses: Secondary | ICD-10-CM | POA: Diagnosis not present

## 2020-01-16 DIAGNOSIS — D510 Vitamin B12 deficiency anemia due to intrinsic factor deficiency: Secondary | ICD-10-CM | POA: Diagnosis not present

## 2020-01-19 DIAGNOSIS — D510 Vitamin B12 deficiency anemia due to intrinsic factor deficiency: Secondary | ICD-10-CM | POA: Diagnosis not present

## 2020-01-25 ENCOUNTER — Ambulatory Visit
Admission: RE | Admit: 2020-01-25 | Discharge: 2020-01-25 | Disposition: A | Discharge status: Home / Self Care | Payer: PPO | Source: Ambulatory Visit | Attending: Orthopaedic Surgery | Admitting: Orthopaedic Surgery

## 2020-01-25 DIAGNOSIS — S92325A Nondisplaced fracture of second metatarsal bone, left foot, initial encounter for closed fracture: Secondary | ICD-10-CM

## 2020-01-25 DIAGNOSIS — S92322D Displaced fracture of second metatarsal bone, left foot, subsequent encounter for fracture with routine healing: Secondary | ICD-10-CM | POA: Diagnosis not present

## 2020-01-29 DIAGNOSIS — S92325D Nondisplaced fracture of second metatarsal bone, left foot, subsequent encounter for fracture with routine healing: Secondary | ICD-10-CM | POA: Diagnosis not present

## 2020-01-30 DIAGNOSIS — D510 Vitamin B12 deficiency anemia due to intrinsic factor deficiency: Secondary | ICD-10-CM | POA: Diagnosis not present

## 2020-01-30 DIAGNOSIS — E559 Vitamin D deficiency, unspecified: Secondary | ICD-10-CM | POA: Diagnosis not present

## 2020-01-30 DIAGNOSIS — E039 Hypothyroidism, unspecified: Secondary | ICD-10-CM | POA: Diagnosis not present

## 2020-01-30 DIAGNOSIS — N183 Chronic kidney disease, stage 3 unspecified: Secondary | ICD-10-CM | POA: Diagnosis not present

## 2020-02-05 ENCOUNTER — Other Ambulatory Visit: Payer: Self-pay

## 2020-02-05 ENCOUNTER — Encounter (INDEPENDENT_AMBULATORY_CARE_PROVIDER_SITE_OTHER): Payer: PPO | Admitting: Ophthalmology

## 2020-02-05 DIAGNOSIS — I10 Essential (primary) hypertension: Secondary | ICD-10-CM

## 2020-02-05 DIAGNOSIS — H33302 Unspecified retinal break, left eye: Secondary | ICD-10-CM | POA: Diagnosis not present

## 2020-02-05 DIAGNOSIS — H35033 Hypertensive retinopathy, bilateral: Secondary | ICD-10-CM | POA: Diagnosis not present

## 2020-02-05 DIAGNOSIS — H353132 Nonexudative age-related macular degeneration, bilateral, intermediate dry stage: Secondary | ICD-10-CM

## 2020-02-05 DIAGNOSIS — H43813 Vitreous degeneration, bilateral: Secondary | ICD-10-CM

## 2020-02-13 DIAGNOSIS — H353131 Nonexudative age-related macular degeneration, bilateral, early dry stage: Secondary | ICD-10-CM | POA: Diagnosis not present

## 2020-02-13 DIAGNOSIS — H43813 Vitreous degeneration, bilateral: Secondary | ICD-10-CM | POA: Diagnosis not present

## 2020-02-13 DIAGNOSIS — Z961 Presence of intraocular lens: Secondary | ICD-10-CM | POA: Diagnosis not present

## 2020-02-13 DIAGNOSIS — H40053 Ocular hypertension, bilateral: Secondary | ICD-10-CM | POA: Diagnosis not present

## 2020-02-26 DIAGNOSIS — D510 Vitamin B12 deficiency anemia due to intrinsic factor deficiency: Secondary | ICD-10-CM | POA: Diagnosis not present

## 2020-03-04 DIAGNOSIS — M8589 Other specified disorders of bone density and structure, multiple sites: Secondary | ICD-10-CM | POA: Diagnosis not present

## 2020-03-04 DIAGNOSIS — Z1231 Encounter for screening mammogram for malignant neoplasm of breast: Secondary | ICD-10-CM | POA: Diagnosis not present

## 2020-03-06 ENCOUNTER — Other Ambulatory Visit: Payer: Self-pay

## 2020-03-06 ENCOUNTER — Emergency Department (HOSPITAL_COMMUNITY)
Admission: EM | Admit: 2020-03-06 | Discharge: 2020-03-06 | Disposition: A | Discharge status: Home / Self Care | Payer: PPO | Attending: Emergency Medicine | Admitting: Emergency Medicine

## 2020-03-06 DIAGNOSIS — I83891 Varicose veins of right lower extremities with other complications: Secondary | ICD-10-CM | POA: Diagnosis not present

## 2020-03-06 DIAGNOSIS — I83899 Varicose veins of unspecified lower extremities with other complications: Secondary | ICD-10-CM | POA: Diagnosis not present

## 2020-03-06 DIAGNOSIS — E039 Hypothyroidism, unspecified: Secondary | ICD-10-CM | POA: Insufficient documentation

## 2020-03-06 DIAGNOSIS — N183 Chronic kidney disease, stage 3 unspecified: Secondary | ICD-10-CM | POA: Diagnosis not present

## 2020-03-06 DIAGNOSIS — I129 Hypertensive chronic kidney disease with stage 1 through stage 4 chronic kidney disease, or unspecified chronic kidney disease: Secondary | ICD-10-CM | POA: Diagnosis not present

## 2020-03-06 DIAGNOSIS — Z79899 Other long term (current) drug therapy: Secondary | ICD-10-CM | POA: Insufficient documentation

## 2020-03-06 DIAGNOSIS — R58 Hemorrhage, not elsewhere classified: Secondary | ICD-10-CM | POA: Diagnosis not present

## 2020-03-06 DIAGNOSIS — I959 Hypotension, unspecified: Secondary | ICD-10-CM | POA: Diagnosis not present

## 2020-03-06 DIAGNOSIS — R0902 Hypoxemia: Secondary | ICD-10-CM | POA: Diagnosis not present

## 2020-03-06 LAB — CBC
HCT: 36.6 % (ref 36.0–46.0)
Hemoglobin: 11.2 g/dL — ABNORMAL LOW (ref 12.0–15.0)
MCH: 29.2 pg (ref 26.0–34.0)
MCHC: 30.6 g/dL (ref 30.0–36.0)
MCV: 95.6 fL (ref 80.0–100.0)
Platelets: 286 10*3/uL (ref 150–400)
RBC: 3.83 MIL/uL — ABNORMAL LOW (ref 3.87–5.11)
RDW: 12.5 % (ref 11.5–15.5)
WBC: 9.9 10*3/uL (ref 4.0–10.5)
nRBC: 0 % (ref 0.0–0.2)

## 2020-03-06 NOTE — ED Triage Notes (Signed)
Pt here from home where she was washing her leg over a varicose vein which then started bleeding. Endorses severe bleeding, soaking multiple towels and EMS reports 300-400 ml blood on the floor. Likely bled for 30 mins prior to EMS arrival. Bleeding controlled on arrival, gauze bandage applied. Pt pale and lethargic on their arrival, NS bolus given in route with improvement.

## 2020-03-06 NOTE — ED Provider Notes (Signed)
[facility] EMERGENCY DEPARTMENT Provider Note   CSN: [id number] Arrival date & time: 03/06/20  1257     History No chief complaint on file.   Rose Moody is a 79 y.o. female presenting for evaluation of bleeding from the right leg.  Patient states she was in the shower using washcloth wash her legs when she started bleeding from her right lower leg.  She applied towels without being able to stop the bleeding.  As such, EMS was called.  She denies pain in the area.  She denies numbness or tingling.  She denies trauma.  She denies currently feeling lightheaded or dizzy.  She is not on blood thinners.  She has a history of varicose veins, has needed treatment with vascular in the past.  Additional history obtained from triage note.  Per triage note, patient likely had about 30 minutes of bleeding.  EMS arrival.  EMS states they were able to control the bleeding with a pressure dressing.  Per EMS, patient was pale and tired on arrival, however this resolved with normal saline.  Additional history obtained in chart review.  Patient with a history of anemia, anxiety, CKD, fibromyalgia, GERD, hypertension, hypothyroidism.  HPI     Past Medical History:  Diagnosis Date  . Anemia   . Anxiety    has had claustrophobia  . Atherosclerosis of renal artery ([place])   . Chronic kidney disease    Stage 3   . Detached retina   . Family history of adverse reaction to anesthesia    sister has trouble waking up  . Fibromyalgia   . GERD (gastroesophageal reflux disease)   . Headache    in her 40's, none now  . Hypertension   . Hypothyroidism   . Incontinence   . Macular degeneration   . Osteoarthritis    knees and back  . Pernicious anemia   . Pneumonia    walking pneumonia  . Stomach ulcer    in her 43s  . Thyroid disease   . UTI (lower urinary tract infection)   . Varicose veins   . Vitamin D deficiency     Patient Active Problem List   Diagnosis Date Noted  .  Memory loss 09/20/2018  . Varicose veins of left lower extremity with pain 10/02/2015  . Essential hypertension, benign 07/28/2013  . Varicose veins of lower extremities with other complications 32/35/5732    Past Surgical History:  Procedure Laterality Date  . ABDOMINAL HYSTERECTOMY  age 61  . APPENDECTOMY  age 27  . ARTERY BIOPSY Left 10/18/2019   Procedure: BIOPSY TEMPORAL ARTERY LEFT;  Surgeon: Rosetta Posner, MD;  Location: [facility];  Service: Vascular;  Laterality: Left;  . BLADDER SUSPENSION  age 74  . COLONOSCOPY    . ENDOVENOUS ABLATION SAPHENOUS VEIN W/ LASER Left 01-16-2016   endovenous laser ablation left small saphenous vein by Curt Jews MD  . laser surgery for retinal detachment Left 2016   Dr. Zigmund Daniel  . RENAL ANGIOGRAM N/A 01/28/2012   Procedure: RENAL ANGIOGRAM;  Surgeon: Jettie Booze, MD;  Location: [facility] CATH LAB;  Service: Cardiovascular;  Laterality: N/A;  . stab phlebectomy Right 12-08-2012   > 20 incisions by Curt Jews MD     OB History   No obstetric history on file.     Family History  Problem Relation Age of Onset  . Cancer Mother        leukemia  . Stroke Mother   .  Melanoma Mother   . Heart disease Father   . Heart disease Sister   . Congestive Heart Failure Sister   . Pneumonia Sister   . Bone cancer Brother   . Pneumonia Sister   . Rheum arthritis Brother   . Heart disease Brother   . Heart disease Brother        open heart surgery  . Dementia Neg Hx     Social History   Tobacco Use  . Smoking status: Never Smoker  . Smokeless tobacco: Never Used  Vaping Use  . Vaping Use: Never used  Substance Use Topics  . Alcohol use: Never    Alcohol/week: 0.0 standard drinks  . Drug use: Never    Home Medications Prior to Admission medications   Medication Sig Start Date End Date Taking? Authorizing Provider  Calcium Carbonate-Vitamin D (CALCIUM + D PO) Take 1 tablet by mouth 2 (two) times daily. Reported on 01/16/2016    [provider]  cephALEXin (KEFLEX) 250 MG capsule Take 250 mg by mouth every evening.    [provider]  cyanocobalamin (,VITAMIN B-12,) 1000 MCG/ML injection Inject 1,000 mcg into the muscle every 30 (thirty) days. Given in doctor's office     [provider]  diphenhydrAMINE (BENADRYL) 25 mg capsule Take 50 mg by mouth at bedtime.     [provider]  dorzolamide-timolol (COSOPT) 22.3-6.8 MG/ML ophthalmic solution 1 drop 2 (two) times daily. 08/25/19   [provider]  estradiol (ESTRACE) 0.1 MG/GM vaginal cream Place 1 g vaginally once a week. Sundays    [provider]  fluticasone (FLONASE) 50 MCG/ACT nasal spray Place into both nostrils as needed for allergies or rhinitis.    [provider]  ipratropium (ATROVENT) 0.06 % nasal spray Place 2-3 sprays into both nostrils daily.  06/30/17   [provider]  latanoprost (XALATAN) 0.005 % ophthalmic solution 1 drop at bedtime. 08/10/19   [provider]  Multiple Vitamins-Minerals (CENTRUM SILVER ULTRA WOMENS) TABS Take 1 tablet by mouth every morning.     [provider]  Multiple Vitamins-Minerals (PRESERVISION/LUTEIN PO) Take 1 tablet by mouth 2 (two) times daily.    [provider]  omeprazole (PRILOSEC) 20 MG capsule Take 20 mg by mouth daily.    [provider]  Polyethyl Glycol-Propyl Glycol (SYSTANE ULTRA OP) Apply 1 drop to eye daily as needed.    [provider]  predniSONE (DELTASONE) 10 MG tablet Take 40 mg by mouth daily.    [provider]  sucralfate (CARAFATE) 1 g tablet Take 1 g by mouth 2 (two) times daily.  08/23/18   [provider]  SYNTHROID 100 MCG tablet Take 100 mcg by mouth daily before breakfast.  07/28/18   [provider]  valsartan (DIOVAN) 80 MG tablet Take 1 tablet (80 mg total) by mouth 2 (two) times daily. If systolic blood pressure <782 hold PM dose 12/18/19 12/17/20  Jettie Booze, MD    Allergies    Trazodone and nefazodone, Minoxidil, Codeine, Contrast media [iodinated diagnostic agents], Cortisone, Dyclonine, Fesoterodine, Lisinopril, Spironolactone, Tequila sunrise flavor, Toviaz [fesoterodine fumarate], Astelin, Azelastine, Azithromycin, Cefuroxime axetil, Ciprofloxacin, Metrizamide, Ondansetron, Zithromax [azithromycin dihydrate], and Zofran  Review of Systems   Review of Systems  Skin: Positive for wound.  Hematological: Does not bruise/bleed easily.  All other systems reviewed and are negative.   Physical Exam Updated Vital Signs BP (!) 111/52   Pulse 68   Temp 97.8 F (  36.6 C)   Resp 17   SpO2 100%   Physical Exam Vitals and nursing note reviewed.  Constitutional:      General: She is not in acute distress.    Appearance: She is well-developed.     Comments: Resting in the bed in no acute distress. no tiredness or lethargy on my exam  HENT:     Head: Normocephalic and atraumatic.  Cardiovascular:     Rate and Rhythm: Normal rate and regular rhythm.     Pulses: Normal pulses.  Pulmonary:     Effort: Pulmonary effort is normal.  Abdominal:     General: There is no distension.  Musculoskeletal:        General: Normal range of motion.     Cervical back: Normal range of motion.     Comments: Pedal pulses 2+ bilaterally. Good distal sensation and cap refill.   Skin:    General: Skin is warm.     Findings: No rash.     Comments: Slow ooze from R low lateral leg.   Neurological:     Mental Status: She is alert and oriented to person, place, and time.     ED Results / Procedures / Treatments   Labs (all labs ordered are listed, but only abnormal results are displayed) Labs Reviewed  CBC - Abnormal; Notable for the following components:      Result Value   RBC 3.83 (*)    Hemoglobin 11.2 (*)    All other components within normal limits    EKG None  Radiology No results found.  Procedures Procedures (including critical care  time)  Medications Ordered in ED Medications - No data to display  ED Course  I have reviewed the triage vital signs and the nursing notes.  Pertinent labs & imaging results that were available during my care of the patient were reviewed by me and considered in my medical decision making (see chart for details).    MDM Rules/Calculators/A&P                          Patient presenting for evaluation of bleeding from her right lower leg.  On exam, patient is neurovascularly intact.  She has a slow mild ooze from a varicose vein.  Will apply quick clot and reassess.  Per EMS report, patient was feeling tired, will check hemoglobin although low suspicion for significant anemia considering stable vital signs and patient being alert and oriented without lethargy or concern on my exam.  CBC shows stable hemoglobin.  Platelets are normal.  On recheck after quick clot, bleeding has resolved. Discussed findings with pt and daughter. Discussed with attending, Dr. Maryan Rued evaluated the pt. At this time, pt appears safe for d/c. Return precautions given. Pt states she understands and agrees to plan.   Final Clinical Impression(s) / ED Diagnoses Final diagnoses:  Bleeding from varicose vein    Rx / DC Orders ED Discharge Orders    None       Franchot Heidelberg, PA-C 03/06/20 1507    Blanchie Dessert, MD 03/07/20 2202

## 2020-03-06 NOTE — ED Notes (Signed)
Quick clot bandage applied by PA.

## 2020-03-06 NOTE — Discharge Instructions (Signed)
Keep the dressing on for the next 24 hours.  After this, remove, wash with soap and water, keep a Band-Aid on as needed. Call your vascular doctor as needed for further evaluation management of your varicose veins. Return to the emergency room if you develop persistent bleeding that does not stop with direct pressure for 20 minutes.  Return if you develop severe pain in your leg, numbness in your foot, color change of your foot, any new, worsening, or concerning symptoms.

## 2020-03-07 DIAGNOSIS — D510 Vitamin B12 deficiency anemia due to intrinsic factor deficiency: Secondary | ICD-10-CM | POA: Diagnosis not present

## 2020-03-07 DIAGNOSIS — I10 Essential (primary) hypertension: Secondary | ICD-10-CM | POA: Diagnosis not present

## 2020-03-07 DIAGNOSIS — M353 Polymyalgia rheumatica: Secondary | ICD-10-CM | POA: Diagnosis not present

## 2020-03-07 DIAGNOSIS — E039 Hypothyroidism, unspecified: Secondary | ICD-10-CM | POA: Diagnosis not present

## 2020-03-07 DIAGNOSIS — I83899 Varicose veins of unspecified lower extremities with other complications: Secondary | ICD-10-CM | POA: Diagnosis not present

## 2020-03-07 DIAGNOSIS — R197 Diarrhea, unspecified: Secondary | ICD-10-CM | POA: Diagnosis not present

## 2020-03-07 DIAGNOSIS — D649 Anemia, unspecified: Secondary | ICD-10-CM | POA: Diagnosis not present

## 2020-03-08 ENCOUNTER — Telehealth: Payer: Self-pay | Admitting: *Deleted

## 2020-03-08 DIAGNOSIS — D649 Anemia, unspecified: Secondary | ICD-10-CM | POA: Diagnosis not present

## 2020-03-08 NOTE — Telephone Encounter (Signed)
Patient called stating she had to go to ER for bleeding of vein right leg she states she was told to follow up with Vascular. I reviewed ER note and patient was scheduled to see Dr Oneida Alar for evaluation since patient has a history of vein procedures in 2017. Advised patient if bleeding reoccurs or if she develops pain and swelling go to ER. Patient verbalized understanding.

## 2020-03-13 ENCOUNTER — Ambulatory Visit (INDEPENDENT_AMBULATORY_CARE_PROVIDER_SITE_OTHER): Payer: PPO | Admitting: Vascular Surgery

## 2020-03-13 ENCOUNTER — Other Ambulatory Visit: Payer: Self-pay

## 2020-03-13 ENCOUNTER — Encounter: Payer: Self-pay | Admitting: Vascular Surgery

## 2020-03-13 VITALS — BP 151/73 | HR 74 | Temp 98.2°F | Resp 16 | Ht 62.5 in | Wt 159.0 lb

## 2020-03-13 DIAGNOSIS — I83893 Varicose veins of bilateral lower extremities with other complications: Secondary | ICD-10-CM

## 2020-03-13 NOTE — Progress Notes (Addendum)
Patient name: Rose Moody MRN: [id number] DOB: 22-Dec-1940 Sex: female  HPI: Rose Moody is a 79 y.o. female, who had a bleeding episode from her right ankle region from a varicose vein approximately 1 week ago.  This required EMS and a visit to the emergency room.  She has not had any bleeding episodes since the past few days.  She also is quite concerned of a blistered varicose vein on the dorsal aspect of her left ankle that is similar and she is worried this may bleed as well.  She states she was put on steroids for polymyalgia and is concerned that this is thinned her skin and made it more fragile.  She had a previous episode of sclerotherapy in our office in the past.  She has also previously had stab avulsions of varicose veins in the right leg in 2014 and laser ablation of the left lesser saphenous in 2017.  These were done by Dr. Donnetta Hutching.  Other medical problems include anemia, CKD 3, hypertension, arthritis, which have been stable.  Past Medical History:  Diagnosis Date  . Anemia   . Anxiety    has had claustrophobia  . Atherosclerosis of renal artery ([place])   . Chronic kidney disease    Stage 3   . Detached retina   . Family history of adverse reaction to anesthesia    sister has trouble waking up  . Fibromyalgia   . GERD (gastroesophageal reflux disease)   . Headache    in her 40's, none now  . Hypertension   . Hypothyroidism   . Incontinence   . Macular degeneration   . Osteoarthritis    knees and back  . Pernicious anemia   . Pneumonia    walking pneumonia  . Stomach ulcer    in her 30s  . Thyroid disease   . UTI (lower urinary tract infection)   . Varicose veins   . Vitamin D deficiency    Past Surgical History:  Procedure Laterality Date  . ABDOMINAL HYSTERECTOMY  age 6  . APPENDECTOMY  age 29  . ARTERY BIOPSY Left 10/18/2019   Procedure: BIOPSY TEMPORAL ARTERY LEFT;  Surgeon: Rosetta Posner, MD;  Location: [place];  Service: Vascular;  Laterality: Left;  . BLADDER  SUSPENSION  age 4  . COLONOSCOPY    . ENDOVENOUS ABLATION SAPHENOUS VEIN W/ LASER Left 01-16-2016   endovenous laser ablation left small saphenous vein by Curt Jews MD  . laser surgery for retinal detachment Left 2016   Dr. Zigmund Daniel  . RENAL ANGIOGRAM N/A 01/28/2012   Procedure: RENAL ANGIOGRAM;  Surgeon: Jettie Booze, MD;  Location: [facility] CATH LAB;  Service: Cardiovascular;  Laterality: N/A;  . stab phlebectomy Right 12-08-2012   > 20 incisions by Curt Jews MD    Family History  Problem Relation Age of Onset  . Cancer Mother        leukemia  . Stroke Mother   . Melanoma Mother   . Heart disease Father   . Heart disease Sister   . Congestive Heart Failure Sister   . Pneumonia Sister   . Bone cancer Brother   . Pneumonia Sister   . Rheum arthritis Brother   . Heart disease Brother   . Heart disease Brother        open heart surgery  . Dementia Neg Hx     SOCIAL HISTORY: Social History   Socioeconomic History  . Marital status: Divorced    Spouse name:  Not on file  . Number of children: 2  . Years of education: 34  . Highest education level: Not on file  Occupational History  . Not on file  Tobacco Use  . Smoking status: Never Smoker  . Smokeless tobacco: Never Used  Vaping Use  . Vaping Use: Never used  Substance and Sexual Activity  . Alcohol use: Never    Alcohol/week: 0.0 standard drinks  . Drug use: Never  . Sexual activity: Not on file  Other Topics Concern  . Not on file  Social History Narrative   She lives at home with her daughter. They share a town home.    No caffeine   Right handed   Social Determinants of Health   Financial Resource Strain:   . Difficulty of Paying Living Expenses:   Food Insecurity:   . Worried About Charity fundraiser in the Last Year:   . Arboriculturist in the Last Year:   Transportation Needs:   . Film/video editor (Medical):   Marland Kitchen Lack of Transportation (Non-Medical):   Physical Activity:   . Days of  Exercise per Week:   . Minutes of Exercise per Session:   Stress:   . Feeling of Stress :   Social Connections:   . Frequency of Communication with Friends and Family:   . Frequency of Social Gatherings with Friends and Family:   . Attends Religious Services:   . Active Member of Clubs or Organizations:   . Attends Archivist Meetings:   Marland Kitchen Marital Status:   Intimate Partner Violence:   . Fear of Current or Ex-Partner:   . Emotionally Abused:   Marland Kitchen Physically Abused:   . Sexually Abused:     Allergies  Allergen Reactions  . Trazodone And Nefazodone Other (See Comments)    Chest pain  . Minoxidil     [place] and tachycardia  . Codeine Nausea And Vomiting  . Contrast Media [Iodinated Diagnostic Agents]     Rash - given during an eye procedure  . Cortisone Other (See Comments) and Swelling    Body swelling. Has taken other steroids Body swelling. Has taken other steroids  . Dyclonine Other (See Comments)    Pt. States she experienced a seizure after taking this medication. Pt. States she experienced a seizure after taking this medication.  . Fesoterodine Other (See Comments)    Dehydration and confusion  . Lisinopril Cough and Other (See Comments)  . Spironolactone     Full body rash  . [employer]     unknown  . Toviaz [Fesoterodine Fumarate] Other (See Comments)    Dehydration and confusion  . Astelin Rash  . Azelastine Rash  . Azithromycin Rash  . Cefuroxime Axetil Rash  . Ciprofloxacin Rash  . Metrizamide Rash    Rash - given during an eye procedure  . Ondansetron Rash  . Zithromax [Azithromycin Dihydrate] Rash  . Zofran Rash    Current Outpatient Medications  Medication Sig Dispense Refill  . Calcium Carbonate-Vitamin D (CALCIUM + D PO) Take 1 tablet by mouth 2 (two) times daily. Reported on 01/16/2016    . cephALEXin (KEFLEX) 250 MG capsule Take 250 mg by mouth every evening.    . cyanocobalamin (,VITAMIN B-12,) 1000 MCG/ML injection Inject  1,000 mcg into the muscle every 30 (thirty) days. Given in doctor's office     . diphenhydrAMINE (BENADRYL) 25 mg capsule Take 50 mg by mouth at bedtime.     Marland Kitchen  dorzolamide-timolol (COSOPT) 22.3-6.8 MG/ML ophthalmic solution 1 drop 2 (two) times daily.    Marland Kitchen estradiol (ESTRACE) 0.1 MG/GM vaginal cream Place 1 g vaginally once a week. Sundays    . fluticasone (FLONASE) 50 MCG/ACT nasal spray Place into both nostrils as needed for allergies or rhinitis.    Marland Kitchen ipratropium (ATROVENT) 0.06 % nasal spray Place 2-3 sprays into both nostrils daily.     Marland Kitchen latanoprost (XALATAN) 0.005 % ophthalmic solution 1 drop at bedtime.    . Multiple Vitamins-Minerals (CENTRUM SILVER ULTRA WOMENS) TABS Take 1 tablet by mouth every morning.     . Multiple Vitamins-Minerals (PRESERVISION/LUTEIN PO) Take 1 tablet by mouth 2 (two) times daily.    Marland Kitchen omeprazole (PRILOSEC) 20 MG capsule Take 20 mg by mouth daily.    Vladimir Faster Glycol-Propyl Glycol (SYSTANE ULTRA OP) Apply 1 drop to eye daily as needed.    . predniSONE (DELTASONE) 10 MG tablet Take 40 mg by mouth daily.    . sucralfate (CARAFATE) 1 g tablet Take 1 g by mouth 2 (two) times daily.     Marland Kitchen SYNTHROID 100 MCG tablet Take 100 mcg by mouth daily before breakfast.     . valsartan (DIOVAN) 80 MG tablet Take 1 tablet (80 mg total) by mouth 2 (two) times daily. If systolic blood pressure <562 hold PM dose 180 tablet 3   No current facility-administered medications for this visit.   Facility-Administered Medications Ordered in Other Visits  Medication Dose Route Frequency Provider Last Rate Last Admin  . diphenhydrAMINE (BENADRYL) injection 25 mg  25 mg Intravenous Once Jettie Booze, MD      . famotidine (PEPCID) 20 mg in sodium chloride 0.9 % 50 mL IVPB  20 mg Intravenous Once Jettie Booze, MD        ROS:   General:  No weight loss, Fever, chills  HEENT: No recent headaches, no nasal bleeding, no visual changes, no sore throat  Neurologic: No  dizziness, blackouts, seizures. No recent symptoms of stroke or mini- stroke. No recent episodes of slurred speech, or temporary blindness.  Cardiac: No recent episodes of chest pain/pressure, no shortness of breath at rest.  No shortness of breath with exertion.  Denies history of atrial fibrillation or irregular heartbeat  Vascular: No history of rest pain in feet.  No history of claudication.  No history of non-healing ulcer, No history of DVT   Pulmonary: No home oxygen, no productive cough, no hemoptysis,  No asthma or wheezing  Musculoskeletal:  [X]  Arthritis, [ ]  Low back pain,  [X]  Joint pain  Hematologic:No history of hypercoagulable state.  No history of easy bleeding.  No history of anemia  Gastrointestinal: No hematochezia or melena,  No gastroesophageal reflux, no trouble swallowing  Urinary: [X]  chronic Kidney disease, [ ]  on HD - [ ]  MWF or [ ]  TTHS, [ ]  Burning with urination, [ ]  Frequent urination, [ ]  Difficulty urinating;   Skin: No rashes  Psychological: No history of anxiety,  No history of depression   Physical Examination  Vitals:   03/13/20 1528  BP: (!) 151/73  Pulse: 74  Resp: 16  Temp: 98.2 F (36.8 C)  TempSrc: Temporal  SpO2: 98%  Weight: 159 lb (72.1 kg)  Height: 5' 2.5" (1.588 m)    Body mass index is 28.62 kg/m.  General:  Alert and oriented, no acute distress HEENT: Normal Neck: No JVD Cardiac: Regular Rate and Rhythm Skin: No rash, brawny hemosiderin staining pretibial gaiter  area bilaterally,  Multiple varicose veins surrounding the ankle and dorsal foot region.  There is a dry eschar on the dorsal aspect of the right ankle from recent bleed.  There is a blister varicosity on the dorsum of the left ankle.  See images below.             Extremity Pulses:  2+  dorsalis pedis bilaterally Musculoskeletal: No deformity or edema  Neurologic: Upper and lower extremity motor 5/5 and symmetric  ASSESSMENT: Symptomatic varicose  veins with bleeding from right ankle area recently requiring EMS visit.  This also required a visit to the hospital and emergency room for bleeding.  She also has a blistered area on the left leg that would also be at high risk for bleeding.  She has diffuse small varicosities in both legs and I believe would benefit from sclerotherapy of these to prevent future bleeding episodes.   PLAN: Sclerotherapy right ankle foot region left ankle calf region requiring 3 vials of sclerotherapy for prevention of further bleeding episodes.  We will get this scheduled with our vein nurse Izora Gala in the near future pending insurance approval.  Patient had several questions today regarding her arterial circulation she does have palpable pulses and I informed her that I thought she would be low risk for limb loss at any point in the future at her current age.  I did discuss with her that if she has any procedures that wound healing may be an issue due to her current steroid use.   Ruta Hinds, MD [facility] Office: [phone]

## 2020-03-14 ENCOUNTER — Ambulatory Visit: Payer: PPO | Admitting: Podiatry

## 2020-03-14 ENCOUNTER — Encounter: Payer: Self-pay | Admitting: Podiatry

## 2020-03-14 DIAGNOSIS — B351 Tinea unguium: Secondary | ICD-10-CM

## 2020-03-14 DIAGNOSIS — M79676 Pain in unspecified toe(s): Secondary | ICD-10-CM

## 2020-03-14 DIAGNOSIS — L600 Ingrowing nail: Secondary | ICD-10-CM | POA: Diagnosis not present

## 2020-03-14 MED ORDER — NEOMYCIN-POLYMYXIN-HC 1 % OT SOLN: OTIC | 1 refills | Status: DC

## 2020-03-14 NOTE — Patient Instructions (Signed)

## 2020-03-15 ENCOUNTER — Telehealth: Payer: Self-pay | Admitting: Podiatry

## 2020-03-15 ENCOUNTER — Telehealth: Payer: Self-pay | Admitting: *Deleted

## 2020-03-15 NOTE — Telephone Encounter (Signed)
Pt called and left her name and phone number no message.

## 2020-03-15 NOTE — Telephone Encounter (Signed)
Pt states she could get betadine, but only found povidine. I told pt the providine is correct. Pt asked if we sterilized our instruments between pts and I told her we did after every use.

## 2020-03-15 NOTE — Telephone Encounter (Signed)
Called pt-  She ended up finding the iodine solution and has started soaking.

## 2020-03-15 NOTE — Telephone Encounter (Signed)
Pt wants to know about using something other than what was suggested because she cannot find the solutions in the store. She has called a couple of times today and would like to speak to someone.

## 2020-03-16 NOTE — Progress Notes (Signed)
Subjective:  Patient ID: Rose Moody, female    DOB: Feb 01, 1941,  MRN: [id number] HPI Chief Complaint  Patient presents with  . Toe Pain    Hallux bilateral - RIGHT-medial border, LEFT-both borders, ingrowns, tender x several months, Dr. Fritzi Mandes said her circulation wasn't good enough, "Dr. Oneida Alar said it was fine to do procedure today"  . New Patient (Initial Visit)    Est pt 11/2016    79 y.o. female presents with the above complaint.   ROS: Denies fever chills nausea vomiting muscle aches pains calf pain back pain chest pain shortness of breath.  Past Medical History:  Diagnosis Date  . Anemia   . Anxiety    has had claustrophobia  . Atherosclerosis of renal artery ([place])   . Chronic kidney disease    Stage 3   . Detached retina   . Family history of adverse reaction to anesthesia    sister has trouble waking up  . Fibromyalgia   . GERD (gastroesophageal reflux disease)   . Headache    in her 40's, none now  . Hypertension   . Hypothyroidism   . Incontinence   . Macular degeneration   . Osteoarthritis    knees and back  . Pernicious anemia   . Pneumonia    walking pneumonia  . Stomach ulcer    in her 33s  . Thyroid disease   . UTI (lower urinary tract infection)   . Varicose veins   . Vitamin D deficiency    Past Surgical History:  Procedure Laterality Date  . ABDOMINAL HYSTERECTOMY  age 63  . APPENDECTOMY  age 12  . ARTERY BIOPSY Left 10/18/2019   Procedure: BIOPSY TEMPORAL ARTERY LEFT;  Surgeon: Rosetta Posner, MD;  Location: [place];  Service: Vascular;  Laterality: Left;  . BLADDER SUSPENSION  age 10  . COLONOSCOPY    . ENDOVENOUS ABLATION SAPHENOUS VEIN W/ LASER Left 01-16-2016   endovenous laser ablation left small saphenous vein by Curt Jews MD  . laser surgery for retinal detachment Left 2016   Dr. Zigmund Daniel  . RENAL ANGIOGRAM N/A 01/28/2012   Procedure: RENAL ANGIOGRAM;  Surgeon: Jettie Booze, MD;  Location: [facility] CATH LAB;  Service: Cardiovascular;   Laterality: N/A;  . stab phlebectomy Right 12-08-2012   > 20 incisions by Curt Jews MD    Current Outpatient Medications:  .  ARMOUR THYROID 15 MG tablet, SMARTSIG:3 Tablet(s) By Mouth, Disp: , Rfl:  .  Calcium Carbonate-Vitamin D (CALCIUM + D PO), Take 1 tablet by mouth 2 (two) times daily. Reported on 01/16/2016, Disp: , Rfl:  .  cephALEXin (KEFLEX) 250 MG capsule, Take 250 mg by mouth every evening., Disp: , Rfl:  .  cyanocobalamin (,VITAMIN B-12,) 1000 MCG/ML injection, Inject 1,000 mcg into the muscle every 30 (thirty) days. Given in doctor's office , Disp: , Rfl:  .  diphenhydrAMINE (BENADRYL) 25 mg capsule, Take 50 mg by mouth at bedtime. , Disp: , Rfl:  .  dorzolamide-timolol (COSOPT) 22.3-6.8 MG/ML ophthalmic solution, 1 drop 2 (two) times daily., Disp: , Rfl:  .  estradiol (ESTRACE) 0.1 MG/GM vaginal cream, Place 1 g vaginally once a week. Sundays, Disp: , Rfl:  .  fluticasone (FLONASE) 50 MCG/ACT nasal spray, Place into both nostrils as needed for allergies or rhinitis., Disp: , Rfl:  .  ipratropium (ATROVENT) 0.06 % nasal spray, Place 2-3 sprays into both nostrils daily. , Disp: , Rfl:  .  latanoprost (XALATAN) 0.005 % ophthalmic solution,  1 drop at bedtime., Disp: , Rfl:  .  Multiple Vitamins-Minerals (CENTRUM SILVER ULTRA WOMENS) TABS, Take 1 tablet by mouth every morning. , Disp: , Rfl:  .  Multiple Vitamins-Minerals (PRESERVISION/LUTEIN PO), Take 1 tablet by mouth 2 (two) times daily., Disp: , Rfl:  .  NEOMYCIN-POLYMYXIN-HYDROCORTISONE (CORTISPORIN) 1 % SOLN OTIC solution, Apply 1-2 drops to toe BID after soaking, Disp: 10 mL, Rfl: 1 .  omeprazole (PRILOSEC) 20 MG capsule, Take 20 mg by mouth daily., Disp: , Rfl:  .  Polyethyl Glycol-Propyl Glycol (SYSTANE ULTRA OP), Apply 1 drop to eye daily as needed., Disp: , Rfl:  .  predniSONE (DELTASONE) 10 MG tablet, Take 40 mg by mouth daily., Disp: , Rfl:  .  sucralfate (CARAFATE) 1 g tablet, Take 1 g by mouth 2 (two) times daily. ,  Disp: , Rfl:  .  SYNTHROID 100 MCG tablet, Take 100 mcg by mouth daily before breakfast. , Disp: , Rfl:  .  SYNTHROID 88 MCG tablet, Take 88 mcg by mouth every morning., Disp: , Rfl:  .  valsartan (DIOVAN) 80 MG tablet, Take 1 tablet (80 mg total) by mouth 2 (two) times daily. If systolic blood pressure <194 hold PM dose, Disp: 180 tablet, Rfl: 3 No current facility-administered medications for this visit.  Facility-Administered Medications Ordered in Other Visits:  .  diphenhydrAMINE (BENADRYL) injection 25 mg, 25 mg, Intravenous, Once, Larae Grooms S, MD .  famotidine (PEPCID) 20 mg in sodium chloride 0.9 % 50 mL IVPB, 20 mg, Intravenous, Once, Jettie Booze, MD  Allergies  Allergen Reactions  . Trazodone And Nefazodone Other (See Comments)    Chest pain  . Minoxidil     [place] and tachycardia  . Codeine Nausea And Vomiting  . Contrast Media [Iodinated Diagnostic Agents]     Rash - given during an eye procedure  . Cortisone Other (See Comments) and Swelling    Body swelling. Has taken other steroids Body swelling. Has taken other steroids  . Dyclonine Other (See Comments)    Pt. States she experienced a seizure after taking this medication. Pt. States she experienced a seizure after taking this medication.  . Fesoterodine Other (See Comments)    Dehydration and confusion  . Lisinopril Cough and Other (See Comments)  . Spironolactone     Full body rash  . [employer]     unknown  . Toviaz [Fesoterodine Fumarate] Other (See Comments)    Dehydration and confusion  . Astelin Rash  . Azelastine Rash  . Azithromycin Rash  . Cefuroxime Axetil Rash  . Ciprofloxacin Rash  . Metrizamide Rash    Rash - given during an eye procedure  . Ondansetron Rash  . Zithromax [Azithromycin Dihydrate] Rash  . Zofran Rash   Review of Systems Objective:  There were no vitals filed for this visit.  General: Well developed, nourished, in no acute distress, alert and  oriented x3   Dermatological: Skin is warm, dry and supple bilateral. Nails x 10 are well maintained; remaining integument appears unremarkable at this time. There are no open sores, no preulcerative lesions, no rash or signs of infection present.  Sharp incurvated nails tibiofibular border of the hallux bilaterally exquisite pain no purulence no malodor sharp incurvated margins.  Vascular: Dorsalis Pedis artery and Posterior Tibial artery pedal pulses are 2/4 bilateral with immedate capillary fill time. Pedal hair growth present. No varicosities and no lower extremity edema present bilateral.   Neruologic: Grossly intact via light touch bilateral.  Vibratory intact via tuning fork bilateral. Protective threshold with Semmes Wienstein monofilament intact to all pedal sites bilateral. Patellar and Achilles deep tendon reflexes 2+ bilateral. No Babinski or clonus noted bilateral.   Musculoskeletal: No gross boney pedal deformities bilateral. No pain, crepitus, or limitation noted with foot and ankle range of motion bilateral. Muscular strength 5/5 in all groups tested bilateral.  Gait: Unassisted, Nonantalgic.    Radiographs:  None taken  Assessment & Plan:   Assessment: Ingrown nails paronychia hallux bilateral.    Plan: Chemical matricectomy was performed tibiofibular border of the hallux bilateral tolerated procedure well without complications.  Was provided both oral and written home-going structure for the care and soaking of the toes.  Was provided a prescription for Cortisporin Otic to be applied twice daily after soaking.     Davonne Jarnigan T. [place], [place]

## 2020-03-22 DIAGNOSIS — E039 Hypothyroidism, unspecified: Secondary | ICD-10-CM | POA: Diagnosis not present

## 2020-03-25 DIAGNOSIS — D649 Anemia, unspecified: Secondary | ICD-10-CM | POA: Diagnosis not present

## 2020-03-25 DIAGNOSIS — E039 Hypothyroidism, unspecified: Secondary | ICD-10-CM | POA: Diagnosis not present

## 2020-03-26 ENCOUNTER — Encounter: Payer: Self-pay | Admitting: Podiatry

## 2020-03-26 ENCOUNTER — Other Ambulatory Visit: Payer: Self-pay

## 2020-03-26 ENCOUNTER — Ambulatory Visit (INDEPENDENT_AMBULATORY_CARE_PROVIDER_SITE_OTHER): Payer: PPO | Admitting: Podiatry

## 2020-03-26 DIAGNOSIS — L600 Ingrowing nail: Secondary | ICD-10-CM

## 2020-03-26 DIAGNOSIS — Z9889 Other specified postprocedural states: Secondary | ICD-10-CM

## 2020-03-27 NOTE — Progress Notes (Signed)
She presents today for follow-up of her matrixectomy's hallux bilaterally.  She states that they are doing much better they feel very good.  Objective: Vital signs are stable she is alert and oriented x3.  No erythema cellulitis drainage or odor there is some mild tenderness.  Assessment: Well-healing matrixectomy's.  Plan: I recommend she continue to soak for about another week or so strong Epson salts and warm water help take some of the soreness out of the toes cover during the day leave open at bedtime.  We will follow-up with her as needed.

## 2020-03-28 ENCOUNTER — Ambulatory Visit: Payer: PPO | Admitting: Podiatry

## 2020-03-28 DIAGNOSIS — Z8349 Family history of other endocrine, nutritional and metabolic diseases: Secondary | ICD-10-CM | POA: Diagnosis not present

## 2020-03-28 DIAGNOSIS — E039 Hypothyroidism, unspecified: Secondary | ICD-10-CM | POA: Diagnosis not present

## 2020-04-03 DIAGNOSIS — R5383 Other fatigue: Secondary | ICD-10-CM | POA: Diagnosis not present

## 2020-04-03 DIAGNOSIS — M353 Polymyalgia rheumatica: Secondary | ICD-10-CM | POA: Diagnosis not present

## 2020-04-05 DIAGNOSIS — S92325D Nondisplaced fracture of second metatarsal bone, left foot, subsequent encounter for fracture with routine healing: Secondary | ICD-10-CM | POA: Diagnosis not present

## 2020-04-09 DIAGNOSIS — N3946 Mixed incontinence: Secondary | ICD-10-CM | POA: Diagnosis not present

## 2020-04-09 DIAGNOSIS — N3020 Other chronic cystitis without hematuria: Secondary | ICD-10-CM | POA: Diagnosis not present

## 2020-04-18 DIAGNOSIS — R922 Inconclusive mammogram: Secondary | ICD-10-CM | POA: Diagnosis not present

## 2020-04-24 DIAGNOSIS — D649 Anemia, unspecified: Secondary | ICD-10-CM | POA: Diagnosis not present

## 2020-04-24 DIAGNOSIS — D510 Vitamin B12 deficiency anemia due to intrinsic factor deficiency: Secondary | ICD-10-CM | POA: Diagnosis not present

## 2020-04-29 ENCOUNTER — Other Ambulatory Visit: Payer: Self-pay

## 2020-04-29 ENCOUNTER — Ambulatory Visit (INDEPENDENT_AMBULATORY_CARE_PROVIDER_SITE_OTHER): Payer: PPO

## 2020-04-29 DIAGNOSIS — I83893 Varicose veins of bilateral lower extremities with other complications: Secondary | ICD-10-CM | POA: Diagnosis not present

## 2020-04-29 NOTE — Progress Notes (Signed)
Treated bilateral lower anterior legs spider and small reticular veins with Asclera 1% administered with a 27g butterfly.  Patient received a total of 85mL. She has had previous bleeds from some of these areas. Pt tolerated very well. She may return next month for another treatment. Provided post procedure care instructions both verbally and on handout.   Photos: Yes.    Compression stockings applied: Yes.

## 2020-05-02 ENCOUNTER — Telehealth: Payer: Self-pay

## 2020-05-02 NOTE — Telephone Encounter (Signed)
Patient called concerned about how long she has to wear the hose before she is able to shower. Her Sclero therapy was on 04/29/2020. I advised she should wear the hose for 48 hours after the procedure and sponge bath. After the 48 hours she is  Ok to take the hose off to shower but she will need to continue to wear the hose for 2 weeks after and only remove the hose to shower. Patient voiced her understanding but then voiced that she had other concerns about the Sclero Therapy she received. I advised that I would give the message to Izora Gala, RN and she will call her back to discuss her concerns. Mrs. Kubicki voiced her understanding.

## 2020-05-09 ENCOUNTER — Telehealth: Payer: Self-pay

## 2020-05-09 NOTE — Telephone Encounter (Signed)
Opened in error

## 2020-05-09 NOTE — Progress Notes (Signed)
Pt asked to come in today for me to look at her legs that where the sclerotherapy had been done last week. She felt she could not tell what areas had been treated. We looked together at the pictures that I had taken prior to treatment and that Dr. Oneida Alar took at her appt with him. In comparison, her legs looked great. We discussed that it takes several weeks/months to see final results. She verbalized understanding. She is wearing her compression stockings as advised post treatment. Will follow PRN.

## 2020-05-17 ENCOUNTER — Telehealth: Payer: Self-pay

## 2020-05-17 NOTE — Telephone Encounter (Signed)
Returned pt's phone call from yesterday. She called to ask similar questions to what she asked in office last Thursday regarding her sclero treatment from last month. She asked how many syringes we used, if the feet usually respond well etc. Pt has used 2 of 3 insurance approved vials of sclero and is aware the 3rd would need to be used within a certain time period, per insurance. She verbalized understanding and has no further questions/concerns at this time.

## 2020-05-23 DIAGNOSIS — E039 Hypothyroidism, unspecified: Secondary | ICD-10-CM | POA: Diagnosis not present

## 2020-05-23 DIAGNOSIS — D649 Anemia, unspecified: Secondary | ICD-10-CM | POA: Diagnosis not present

## 2020-05-23 DIAGNOSIS — Z8349 Family history of other endocrine, nutritional and metabolic diseases: Secondary | ICD-10-CM | POA: Diagnosis not present

## 2020-05-23 DIAGNOSIS — D510 Vitamin B12 deficiency anemia due to intrinsic factor deficiency: Secondary | ICD-10-CM | POA: Diagnosis not present

## 2020-05-23 DIAGNOSIS — Z23 Encounter for immunization: Secondary | ICD-10-CM | POA: Diagnosis not present

## 2020-05-30 DIAGNOSIS — I8312 Varicose veins of left lower extremity with inflammation: Secondary | ICD-10-CM | POA: Diagnosis not present

## 2020-05-30 DIAGNOSIS — I83893 Varicose veins of bilateral lower extremities with other complications: Secondary | ICD-10-CM | POA: Diagnosis not present

## 2020-05-30 DIAGNOSIS — I8311 Varicose veins of right lower extremity with inflammation: Secondary | ICD-10-CM | POA: Diagnosis not present

## 2020-06-06 DIAGNOSIS — I8311 Varicose veins of right lower extremity with inflammation: Secondary | ICD-10-CM | POA: Diagnosis not present

## 2020-06-12 DIAGNOSIS — H40053 Ocular hypertension, bilateral: Secondary | ICD-10-CM | POA: Diagnosis not present

## 2020-06-12 DIAGNOSIS — R519 Headache, unspecified: Secondary | ICD-10-CM | POA: Diagnosis not present

## 2020-06-12 DIAGNOSIS — H43813 Vitreous degeneration, bilateral: Secondary | ICD-10-CM | POA: Diagnosis not present

## 2020-06-12 DIAGNOSIS — Z961 Presence of intraocular lens: Secondary | ICD-10-CM | POA: Diagnosis not present

## 2020-06-12 DIAGNOSIS — H33002 Unspecified retinal detachment with retinal break, left eye: Secondary | ICD-10-CM | POA: Diagnosis not present

## 2020-06-12 DIAGNOSIS — H353131 Nonexudative age-related macular degeneration, bilateral, early dry stage: Secondary | ICD-10-CM | POA: Diagnosis not present

## 2020-06-25 DIAGNOSIS — D510 Vitamin B12 deficiency anemia due to intrinsic factor deficiency: Secondary | ICD-10-CM | POA: Diagnosis not present

## 2020-07-02 DIAGNOSIS — I83811 Varicose veins of right lower extremities with pain: Secondary | ICD-10-CM | POA: Diagnosis not present

## 2020-07-02 DIAGNOSIS — I8311 Varicose veins of right lower extremity with inflammation: Secondary | ICD-10-CM | POA: Diagnosis not present

## 2020-07-02 DIAGNOSIS — I83891 Varicose veins of right lower extremities with other complications: Secondary | ICD-10-CM | POA: Diagnosis not present

## 2020-07-04 DIAGNOSIS — I8312 Varicose veins of left lower extremity with inflammation: Secondary | ICD-10-CM | POA: Diagnosis not present

## 2020-07-09 DIAGNOSIS — R5383 Other fatigue: Secondary | ICD-10-CM | POA: Diagnosis not present

## 2020-07-09 DIAGNOSIS — M353 Polymyalgia rheumatica: Secondary | ICD-10-CM | POA: Diagnosis not present

## 2020-07-15 DIAGNOSIS — R0981 Nasal congestion: Secondary | ICD-10-CM | POA: Diagnosis not present

## 2020-07-15 DIAGNOSIS — M353 Polymyalgia rheumatica: Secondary | ICD-10-CM | POA: Diagnosis not present

## 2020-07-22 DIAGNOSIS — D510 Vitamin B12 deficiency anemia due to intrinsic factor deficiency: Secondary | ICD-10-CM | POA: Diagnosis not present

## 2020-07-30 DIAGNOSIS — I83811 Varicose veins of right lower extremities with pain: Secondary | ICD-10-CM | POA: Diagnosis not present

## 2020-07-30 DIAGNOSIS — R58 Hemorrhage, not elsewhere classified: Secondary | ICD-10-CM | POA: Diagnosis not present

## 2020-07-30 DIAGNOSIS — I8311 Varicose veins of right lower extremity with inflammation: Secondary | ICD-10-CM | POA: Diagnosis not present

## 2020-08-06 DIAGNOSIS — I8312 Varicose veins of left lower extremity with inflammation: Secondary | ICD-10-CM | POA: Diagnosis not present

## 2020-08-06 DIAGNOSIS — I83812 Varicose veins of left lower extremities with pain: Secondary | ICD-10-CM | POA: Diagnosis not present

## 2020-08-06 DIAGNOSIS — I83892 Varicose veins of left lower extremities with other complications: Secondary | ICD-10-CM | POA: Diagnosis not present

## 2020-08-13 DIAGNOSIS — D649 Anemia, unspecified: Secondary | ICD-10-CM | POA: Diagnosis not present

## 2020-08-13 DIAGNOSIS — E559 Vitamin D deficiency, unspecified: Secondary | ICD-10-CM | POA: Diagnosis not present

## 2020-08-13 DIAGNOSIS — I8311 Varicose veins of right lower extremity with inflammation: Secondary | ICD-10-CM | POA: Diagnosis not present

## 2020-08-13 DIAGNOSIS — I10 Essential (primary) hypertension: Secondary | ICD-10-CM | POA: Diagnosis not present

## 2020-08-13 DIAGNOSIS — D510 Vitamin B12 deficiency anemia due to intrinsic factor deficiency: Secondary | ICD-10-CM | POA: Diagnosis not present

## 2020-08-13 DIAGNOSIS — E039 Hypothyroidism, unspecified: Secondary | ICD-10-CM | POA: Diagnosis not present

## 2020-08-21 DIAGNOSIS — D510 Vitamin B12 deficiency anemia due to intrinsic factor deficiency: Secondary | ICD-10-CM | POA: Diagnosis not present

## 2020-08-30 DIAGNOSIS — D631 Anemia in chronic kidney disease: Secondary | ICD-10-CM | POA: Diagnosis not present

## 2020-08-30 DIAGNOSIS — E559 Vitamin D deficiency, unspecified: Secondary | ICD-10-CM | POA: Diagnosis not present

## 2020-08-30 DIAGNOSIS — I129 Hypertensive chronic kidney disease with stage 1 through stage 4 chronic kidney disease, or unspecified chronic kidney disease: Secondary | ICD-10-CM | POA: Diagnosis not present

## 2020-08-30 DIAGNOSIS — M353 Polymyalgia rheumatica: Secondary | ICD-10-CM | POA: Diagnosis not present

## 2020-08-30 DIAGNOSIS — N189 Chronic kidney disease, unspecified: Secondary | ICD-10-CM | POA: Diagnosis not present

## 2020-08-30 DIAGNOSIS — N1832 Chronic kidney disease, stage 3b: Secondary | ICD-10-CM | POA: Diagnosis not present

## 2020-08-30 DIAGNOSIS — N1831 Chronic kidney disease, stage 3a: Secondary | ICD-10-CM | POA: Diagnosis not present

## 2020-09-03 ENCOUNTER — Other Ambulatory Visit: Payer: Self-pay | Admitting: Nephrology

## 2020-09-03 DIAGNOSIS — N1831 Chronic kidney disease, stage 3a: Secondary | ICD-10-CM

## 2020-09-04 DIAGNOSIS — I8311 Varicose veins of right lower extremity with inflammation: Secondary | ICD-10-CM | POA: Diagnosis not present

## 2020-09-17 ENCOUNTER — Ambulatory Visit
Admission: RE | Admit: 2020-09-17 | Discharge: 2020-09-17 | Disposition: A | Discharge status: Home / Self Care | Payer: PPO | Source: Ambulatory Visit | Attending: Nephrology | Admitting: Nephrology

## 2020-09-17 DIAGNOSIS — N1831 Chronic kidney disease, stage 3a: Secondary | ICD-10-CM

## 2020-09-18 DIAGNOSIS — I8312 Varicose veins of left lower extremity with inflammation: Secondary | ICD-10-CM | POA: Diagnosis not present

## 2020-09-18 DIAGNOSIS — I83812 Varicose veins of left lower extremities with pain: Secondary | ICD-10-CM | POA: Diagnosis not present

## 2020-09-18 DIAGNOSIS — I83892 Varicose veins of left lower extremities with other complications: Secondary | ICD-10-CM | POA: Diagnosis not present

## 2020-09-19 DIAGNOSIS — D510 Vitamin B12 deficiency anemia due to intrinsic factor deficiency: Secondary | ICD-10-CM | POA: Diagnosis not present

## 2020-10-08 DIAGNOSIS — I83892 Varicose veins of left lower extremities with other complications: Secondary | ICD-10-CM | POA: Diagnosis not present

## 2020-10-08 DIAGNOSIS — I83812 Varicose veins of left lower extremities with pain: Secondary | ICD-10-CM | POA: Diagnosis not present

## 2020-10-08 DIAGNOSIS — I8312 Varicose veins of left lower extremity with inflammation: Secondary | ICD-10-CM | POA: Diagnosis not present

## 2020-10-09 DIAGNOSIS — Z8349 Family history of other endocrine, nutritional and metabolic diseases: Secondary | ICD-10-CM | POA: Diagnosis not present

## 2020-10-09 DIAGNOSIS — E039 Hypothyroidism, unspecified: Secondary | ICD-10-CM | POA: Diagnosis not present

## 2020-10-09 DIAGNOSIS — R635 Abnormal weight gain: Secondary | ICD-10-CM | POA: Diagnosis not present

## 2020-10-15 DIAGNOSIS — I8312 Varicose veins of left lower extremity with inflammation: Secondary | ICD-10-CM | POA: Diagnosis not present

## 2020-10-15 DIAGNOSIS — I83812 Varicose veins of left lower extremities with pain: Secondary | ICD-10-CM | POA: Diagnosis not present

## 2020-10-16 DIAGNOSIS — H43813 Vitreous degeneration, bilateral: Secondary | ICD-10-CM | POA: Diagnosis not present

## 2020-10-16 DIAGNOSIS — H40053 Ocular hypertension, bilateral: Secondary | ICD-10-CM | POA: Diagnosis not present

## 2020-10-16 DIAGNOSIS — R519 Headache, unspecified: Secondary | ICD-10-CM | POA: Diagnosis not present

## 2020-10-16 DIAGNOSIS — H353131 Nonexudative age-related macular degeneration, bilateral, early dry stage: Secondary | ICD-10-CM | POA: Diagnosis not present

## 2020-10-16 DIAGNOSIS — H33002 Unspecified retinal detachment with retinal break, left eye: Secondary | ICD-10-CM | POA: Diagnosis not present

## 2020-10-16 DIAGNOSIS — Z961 Presence of intraocular lens: Secondary | ICD-10-CM | POA: Diagnosis not present

## 2020-10-17 DIAGNOSIS — D510 Vitamin B12 deficiency anemia due to intrinsic factor deficiency: Secondary | ICD-10-CM | POA: Diagnosis not present

## 2020-10-31 DIAGNOSIS — N1831 Chronic kidney disease, stage 3a: Secondary | ICD-10-CM | POA: Diagnosis not present

## 2020-10-31 DIAGNOSIS — M353 Polymyalgia rheumatica: Secondary | ICD-10-CM | POA: Diagnosis not present

## 2020-10-31 DIAGNOSIS — R5383 Other fatigue: Secondary | ICD-10-CM | POA: Diagnosis not present

## 2020-11-05 DIAGNOSIS — I83892 Varicose veins of left lower extremities with other complications: Secondary | ICD-10-CM | POA: Diagnosis not present

## 2020-11-05 DIAGNOSIS — I8312 Varicose veins of left lower extremity with inflammation: Secondary | ICD-10-CM | POA: Diagnosis not present

## 2020-11-12 ENCOUNTER — Other Ambulatory Visit (HOSPITAL_COMMUNITY): Payer: Self-pay | Admitting: Family Medicine

## 2020-11-12 DIAGNOSIS — T148XXA Other injury of unspecified body region, initial encounter: Secondary | ICD-10-CM

## 2020-11-13 ENCOUNTER — Ambulatory Visit (HOSPITAL_COMMUNITY): Admission: RE | Admit: 2020-11-13 | Payer: PPO | Source: Ambulatory Visit

## 2020-11-13 ENCOUNTER — Ambulatory Visit (HOSPITAL_COMMUNITY)
Admission: RE | Admit: 2020-11-13 | Discharge: 2020-11-13 | Disposition: A | Discharge status: Home / Self Care | Payer: PPO | Source: Ambulatory Visit | Attending: Family Medicine | Admitting: Family Medicine

## 2020-11-13 ENCOUNTER — Other Ambulatory Visit: Payer: Self-pay

## 2020-11-13 DIAGNOSIS — T148XXA Other injury of unspecified body region, initial encounter: Secondary | ICD-10-CM | POA: Diagnosis not present

## 2020-11-15 DIAGNOSIS — D510 Vitamin B12 deficiency anemia due to intrinsic factor deficiency: Secondary | ICD-10-CM | POA: Diagnosis not present

## 2020-11-25 NOTE — Progress Notes (Signed)
Cardiology Office Note   Date:  11/26/2020   ID:  Rose Moody, DOB Oct 20, 1940, MRN [id number]  PCP:  Lawerance Cruel, MD    No chief complaint on file.  HTN  Wt Readings from Last 3 Encounters:  11/26/20 172 lb 3.2 oz (78.1 kg)  03/13/20 159 lb (72.1 kg)  11/27/19 159 lb (72.1 kg)       History of Present Illness: Rose Moody is a 80 y.o. female  With HTN. Was thought to have renal artery stenosis by duplex, but then angiogram was performed in June 2013.There was no evidence of renal artery stenosis. She did have dual arterial supply to both kidneys.  Retired from [employer].  BP was well controlled in May 2019.   After that, she has had high readings at home. Valsartan was increased from 80 mg to 160 mg.  In 2020, it was noted: "SHe has had some fatigue/DOE as well. She reports weakness with minimal exertion. She can feel weak when she raises her arms up in the shower to wash her hair."   Temporal artery biopsy was negative in 2021.  Despite this, rheumatologist felt that she could still have temporal arteritis.  She had a fall in 2/21 where she broke her foot.  It was after the shower.  In 2021, it was noted: "She checks her BP at home and will skip her valsartan if it is "low".  I reviewed her home readings.  She has several readings that are in the 02H systolic.     Prednisone has been started at high dose and has been tapered, for PMR. Thsi has likely affected her BP as well."  She has gained a lot of weight after starting prednisone.  She has lost hair as well.  BP has been higher on rare occasions.  THere are times when it is low.  SHe feels fatigued when it is less than 120 mm Hg. She checks her BP frequently.   Prednisone is being tapered.  She may need MTX.  Her foot never healed prperly per her report.  Bleeding varicose wein in 7/21 prompted visit to ER.  She subsequently had surgery.  Denies : Chest pain. Dizziness. Leg edema.  Nitroglycerin use. Orthopnea. Palpitations. Paroxysmal nocturnal dyspnea. Shortness of breath. Syncope.       Past Medical History:  Diagnosis Date  . Anemia   . Anxiety    has had claustrophobia  . Atherosclerosis of renal artery ([place])   . Chronic kidney disease    Stage 3   . Detached retina   . Family history of adverse reaction to anesthesia    sister has trouble waking up  . Fibromyalgia   . GERD (gastroesophageal reflux disease)   . Headache    in her 40's, none now  . Hypertension   . Hypothyroidism   . Incontinence   . Macular degeneration   . Osteoarthritis    knees and back  . Pernicious anemia   . Pneumonia    walking pneumonia  . Stomach ulcer    in her 72s  . Thyroid disease   . UTI (lower urinary tract infection)   . Varicose veins   . Vitamin D deficiency     Past Surgical History:  Procedure Laterality Date  . ABDOMINAL HYSTERECTOMY  age 38  . APPENDECTOMY  age 41  . ARTERY BIOPSY Left 10/18/2019   Procedure: BIOPSY TEMPORAL ARTERY LEFT;  Surgeon: Rosetta Posner, MD;  Location: [facility]  OR;  Service: Vascular;  Laterality: Left;  . BLADDER SUSPENSION  age 58  . COLONOSCOPY    . ENDOVENOUS ABLATION SAPHENOUS VEIN W/ LASER Left 01-16-2016   endovenous laser ablation left small saphenous vein by Curt Jews MD  . laser surgery for retinal detachment Left 2016   Dr. Zigmund Daniel  . RENAL ANGIOGRAM N/A 01/28/2012   Procedure: RENAL ANGIOGRAM;  Surgeon: Jettie Booze, MD;  Location: [facility] CATH LAB;  Service: Cardiovascular;  Laterality: N/A;  . stab phlebectomy Right 12-08-2012   > 20 incisions by Curt Jews MD     Current Outpatient Medications  Medication Sig Dispense Refill  . Calcium Carbonate-Vitamin D (CALCIUM + D PO) Take 1 tablet by mouth 2 (two) times daily. Reported on 01/16/2016    . cephALEXin (KEFLEX) 250 MG capsule Take 250 mg by mouth every evening.    . cyanocobalamin (,VITAMIN B-12,) 1000 MCG/ML injection Inject 1,000 mcg into the muscle every  30 (thirty) days. Given in doctor's office    . diphenhydrAMINE (BENADRYL) 25 mg capsule Take 50 mg by mouth at bedtime.    . dorzolamide-timolol (COSOPT) 22.3-6.8 MG/ML ophthalmic solution 1 drop 2 (two) times daily.    Marland Kitchen estradiol (ESTRACE) 0.1 MG/GM vaginal cream Place 1 g vaginally once a week. Sundays    . fluticasone (FLONASE) 50 MCG/ACT nasal spray Place into both nostrils as needed for allergies or rhinitis.    Marland Kitchen ipratropium (ATROVENT) 0.06 % nasal spray Place 2-3 sprays into both nostrils daily.     Marland Kitchen latanoprost (XALATAN) 0.005 % ophthalmic solution 1 drop at bedtime.    . Multiple Vitamins-Minerals (CENTRUM SILVER ULTRA WOMENS) TABS Take 1 tablet by mouth every morning.    . Multiple Vitamins-Minerals (PRESERVISION/LUTEIN PO) Take 1 tablet by mouth 2 (two) times daily.    . NEOMYCIN-POLYMYXIN-HYDROCORTISONE (CORTISPORIN) 1 % SOLN OTIC solution Apply 1-2 drops to toe BID after soaking 10 mL 1  . omeprazole (PRILOSEC) 20 MG capsule Take 20 mg by mouth daily.    Vladimir Faster Glycol-Propyl Glycol (SYSTANE ULTRA OP) Apply 1 drop to eye daily as needed.    . predniSONE (DELTASONE) 10 MG tablet Take 40 mg by mouth daily.    . sucralfate (CARAFATE) 1 g tablet Take 1 g by mouth 2 (two) times daily.     . valsartan (DIOVAN) 80 MG tablet Take 1 tablet (80 mg total) by mouth 2 (two) times daily. If systolic blood pressure <093 hold PM dose 180 tablet 3  . SYNTHROID 75 MCG tablet Take 75 mcg by mouth every morning.     No current facility-administered medications for this visit.   Facility-Administered Medications Ordered in Other Visits  Medication Dose Route Frequency Provider Last Rate Last Admin  . diphenhydrAMINE (BENADRYL) injection 25 mg  25 mg Intravenous Once Jettie Booze, MD      . famotidine (PEPCID) 20 mg in sodium chloride 0.9 % 50 mL IVPB  20 mg Intravenous Once Jettie Booze, MD        Allergies:   Trazodone and nefazodone, Minoxidil, Amlodipine besylate,  Candesartan cilexetil, Codeine, Contrast media [iodinated diagnostic agents], Cortisone, Dyclonine, Fesoterodine, Irbesartan, Lisinopril, Spironolactone, Tequila sunrise flavor, Toviaz [fesoterodine fumarate], Astelin, Azelastine, Azithromycin, Cefuroxime axetil, Ciprofloxacin, Metrizamide, Ondansetron, Zithromax [azithromycin dihydrate], and Zofran    Social History:  The patient  reports that she has never smoked. She has never used smokeless tobacco. She reports that she does not drink alcohol and does not use drugs.   Family  History:  The patient's family history includes Bone cancer in her brother; Cancer in her mother; Congestive Heart Failure in her sister; Heart disease in her brother, brother, father, and sister; Melanoma in her mother; Pneumonia in her sister and sister; Rheum arthritis in her brother; Stroke in her mother.    ROS:  Please see the history of present illness.   Otherwise, review of systems are positive for weight gain.   All other systems are reviewed and negative.    PHYSICAL EXAM: VS:  BP 140/74   Pulse 72   Ht 5' 2.5" (1.588 m)   Wt 172 lb 3.2 oz (78.1 kg)   SpO2 95%   BMI 30.99 kg/m  , BMI Body mass index is 30.99 kg/m. GEN: Well nourished, well developed, in no acute distress  HEENT: normal  Neck: no JVD, carotid bruits, or masses Cardiac: RRR; no murmurs, rubs, or gallops,no edema  Respiratory:  clear to auscultation bilaterally, normal work of breathing GI: soft, nontender, nondistended, + BS MS: no deformity or atrophy  Skin: warm and dry, no rash Neuro:  Strength and sensation are intact Psych: euthymic mood, full affect   EKG:   The ekg ordered today demonstrates NSR, no ST changes   Recent Labs: 03/06/2020: Hemoglobin 11.2; Platelets 286   Lipid Panel No results found for: CHOL, TRIG, HDL, CHOLHDL, VLDL, LDLCALC, LDLDIRECT   Other studies Reviewed: Additional studies/ records that were reviewed today with results demonstrating: TC 169 in  06/2019.   ASSESSMENT AND PLAN:  1. HTN: Mostly controlled in the 017-494W range systolic.  Rare readings in the 160.  WOuld not add more meds until she is off of the prednisone.  SOmetimes, she decreases her valsartan when her BP is low.  Once she is off the prednisone, this may help her blood pressure stabilize.  She will let us know if she has systolics below 90 or above 150 on a consistent basis.  If so, may need to adjust medication. 2. Venous insufficiency: varicose veins. Followed by Narda Amber vein.  Wearing compression stockings. 3. DOE:  Exercise was limited while she had a broken foot.  She has gained weight as well.  There is likely some component of deconditioning, but the symptom is overall stable.   Current medicines are reviewed at length with the patient today.  The patient concerns regarding her medicines were addressed.  The following changes have been made:  No change  Labs/ tests ordered today include:  No orders of the defined types were placed in this encounter.   Recommend 150 minutes/week of aerobic exercise Low fat, low carb, high fiber diet recommended  Disposition:   FU in 1 year   Signed, Larae Grooms, MD  11/26/2020 3:09 [facility] [address], [place], [place]  [address] Phone: [phone]; Fax: [phone]

## 2020-11-26 ENCOUNTER — Encounter: Payer: Self-pay | Admitting: Interventional Cardiology

## 2020-11-26 ENCOUNTER — Other Ambulatory Visit: Payer: Self-pay

## 2020-11-26 ENCOUNTER — Ambulatory Visit: Payer: PPO | Admitting: Interventional Cardiology

## 2020-11-26 VITALS — BP 140/74 | HR 72 | Ht 62.5 in | Wt 172.2 lb

## 2020-11-26 DIAGNOSIS — M353 Polymyalgia rheumatica: Secondary | ICD-10-CM | POA: Diagnosis not present

## 2020-11-26 DIAGNOSIS — R0600 Dyspnea, unspecified: Secondary | ICD-10-CM

## 2020-11-26 DIAGNOSIS — I10 Essential (primary) hypertension: Secondary | ICD-10-CM

## 2020-11-26 DIAGNOSIS — I83812 Varicose veins of left lower extremities with pain: Secondary | ICD-10-CM

## 2020-11-26 DIAGNOSIS — R0609 Other forms of dyspnea: Secondary | ICD-10-CM

## 2020-11-26 NOTE — Patient Instructions (Signed)
Medication Instructions:  Your physician recommends that you continue on your current medications as directed. Please refer to the Current Medication list given to you today.  *If you need a refill on your cardiac medications before your next appointment, please call your pharmacy*   Lab Work: none If you have labs (blood work) drawn today and your tests are completely normal, you will receive your results only by: Marland Kitchen MyChart Message (if you have MyChart) OR . A paper copy in the mail If you have any lab test that is abnormal or we need to change your treatment, we will call you to review the results.   Testing/Procedures: none   Follow-Up: At [facility], you and your health needs are our priority.  As part of our continuing mission to provide you with exceptional heart care, we have created designated Provider Care Teams.  These Care Teams include your primary Cardiologist (physician) and Advanced Practice Providers (APPs -  Physician Assistants and Nurse Practitioners) who all work together to provide you with the care you need, when you need it.  We recommend signing up for the patient portal called "MyChart".  Sign up information is provided on this After Visit Summary.  MyChart is used to connect with patients for Virtual Visits (Telemedicine).  Patients are able to view lab/test results, encounter notes, upcoming appointments, etc.  Non-urgent messages can be sent to your provider as well.   To learn more about what you can do with MyChart, go to [email or web address].    Your next appointment:   12 month(s)  The format for your next appointment:   In Person  Provider:   You may see Larae Grooms, MD or one of the following Advanced Practice Providers on your designated Care Team:    Melina Copa, PA-C  Ermalinda Barrios, PA-C    Other Instructions Check blood pressure at home.  Let us know if running greater than 150/90 or if running low (systolic less than 90)

## 2020-11-27 DIAGNOSIS — M205X2 Other deformities of toe(s) (acquired), left foot: Secondary | ICD-10-CM | POA: Diagnosis not present

## 2020-11-27 DIAGNOSIS — L602 Onychogryphosis: Secondary | ICD-10-CM | POA: Diagnosis not present

## 2020-11-27 DIAGNOSIS — M205X1 Other deformities of toe(s) (acquired), right foot: Secondary | ICD-10-CM | POA: Diagnosis not present

## 2020-12-13 DIAGNOSIS — E039 Hypothyroidism, unspecified: Secondary | ICD-10-CM | POA: Diagnosis not present

## 2020-12-13 DIAGNOSIS — D510 Vitamin B12 deficiency anemia due to intrinsic factor deficiency: Secondary | ICD-10-CM | POA: Diagnosis not present

## 2020-12-18 DIAGNOSIS — M353 Polymyalgia rheumatica: Secondary | ICD-10-CM | POA: Diagnosis not present

## 2020-12-18 DIAGNOSIS — E559 Vitamin D deficiency, unspecified: Secondary | ICD-10-CM | POA: Diagnosis not present

## 2020-12-18 DIAGNOSIS — N189 Chronic kidney disease, unspecified: Secondary | ICD-10-CM | POA: Diagnosis not present

## 2020-12-18 DIAGNOSIS — N182 Chronic kidney disease, stage 2 (mild): Secondary | ICD-10-CM | POA: Diagnosis not present

## 2020-12-18 DIAGNOSIS — I129 Hypertensive chronic kidney disease with stage 1 through stage 4 chronic kidney disease, or unspecified chronic kidney disease: Secondary | ICD-10-CM | POA: Diagnosis not present

## 2020-12-18 DIAGNOSIS — D631 Anemia in chronic kidney disease: Secondary | ICD-10-CM | POA: Diagnosis not present

## 2020-12-26 DIAGNOSIS — J019 Acute sinusitis, unspecified: Secondary | ICD-10-CM | POA: Diagnosis not present

## 2020-12-26 DIAGNOSIS — Z03818 Encounter for observation for suspected exposure to other biological agents ruled out: Secondary | ICD-10-CM | POA: Diagnosis not present

## 2020-12-26 DIAGNOSIS — J029 Acute pharyngitis, unspecified: Secondary | ICD-10-CM | POA: Diagnosis not present

## 2020-12-26 DIAGNOSIS — R059 Cough, unspecified: Secondary | ICD-10-CM | POA: Diagnosis not present

## 2021-01-10 DIAGNOSIS — D510 Vitamin B12 deficiency anemia due to intrinsic factor deficiency: Secondary | ICD-10-CM | POA: Diagnosis not present

## 2021-01-14 DIAGNOSIS — Z6830 Body mass index (BMI) 30.0-30.9, adult: Secondary | ICD-10-CM | POA: Diagnosis not present

## 2021-01-14 DIAGNOSIS — N1831 Chronic kidney disease, stage 3a: Secondary | ICD-10-CM | POA: Diagnosis not present

## 2021-01-14 DIAGNOSIS — E669 Obesity, unspecified: Secondary | ICD-10-CM | POA: Diagnosis not present

## 2021-01-14 DIAGNOSIS — R5383 Other fatigue: Secondary | ICD-10-CM | POA: Diagnosis not present

## 2021-01-14 DIAGNOSIS — M353 Polymyalgia rheumatica: Secondary | ICD-10-CM | POA: Diagnosis not present

## 2021-01-15 DIAGNOSIS — E039 Hypothyroidism, unspecified: Secondary | ICD-10-CM | POA: Diagnosis not present

## 2021-01-16 DIAGNOSIS — M205X2 Other deformities of toe(s) (acquired), left foot: Secondary | ICD-10-CM | POA: Diagnosis not present

## 2021-01-16 DIAGNOSIS — L602 Onychogryphosis: Secondary | ICD-10-CM | POA: Diagnosis not present

## 2021-01-16 DIAGNOSIS — M205X1 Other deformities of toe(s) (acquired), right foot: Secondary | ICD-10-CM | POA: Diagnosis not present

## 2021-01-27 DIAGNOSIS — Z23 Encounter for immunization: Secondary | ICD-10-CM | POA: Diagnosis not present

## 2021-02-02 ENCOUNTER — Other Ambulatory Visit: Payer: Self-pay | Admitting: Interventional Cardiology

## 2021-02-02 DIAGNOSIS — R0600 Dyspnea, unspecified: Secondary | ICD-10-CM

## 2021-02-02 DIAGNOSIS — Z8249 Family history of ischemic heart disease and other diseases of the circulatory system: Secondary | ICD-10-CM

## 2021-02-02 DIAGNOSIS — R531 Weakness: Secondary | ICD-10-CM

## 2021-02-02 DIAGNOSIS — R0609 Other forms of dyspnea: Secondary | ICD-10-CM

## 2021-02-02 DIAGNOSIS — I10 Essential (primary) hypertension: Secondary | ICD-10-CM

## 2021-02-03 NOTE — Telephone Encounter (Signed)
Rx(s) sent to pharmacy electronically.  

## 2021-02-05 ENCOUNTER — Other Ambulatory Visit: Payer: Self-pay

## 2021-02-05 ENCOUNTER — Encounter (INDEPENDENT_AMBULATORY_CARE_PROVIDER_SITE_OTHER): Payer: PPO | Admitting: Ophthalmology

## 2021-02-05 DIAGNOSIS — H33302 Unspecified retinal break, left eye: Secondary | ICD-10-CM | POA: Diagnosis not present

## 2021-02-05 DIAGNOSIS — H353132 Nonexudative age-related macular degeneration, bilateral, intermediate dry stage: Secondary | ICD-10-CM | POA: Diagnosis not present

## 2021-02-05 DIAGNOSIS — H35033 Hypertensive retinopathy, bilateral: Secondary | ICD-10-CM | POA: Diagnosis not present

## 2021-02-05 DIAGNOSIS — I10 Essential (primary) hypertension: Secondary | ICD-10-CM | POA: Diagnosis not present

## 2021-02-05 DIAGNOSIS — H43813 Vitreous degeneration, bilateral: Secondary | ICD-10-CM

## 2021-02-07 DIAGNOSIS — J029 Acute pharyngitis, unspecified: Secondary | ICD-10-CM | POA: Diagnosis not present

## 2021-02-07 DIAGNOSIS — E039 Hypothyroidism, unspecified: Secondary | ICD-10-CM | POA: Diagnosis not present

## 2021-02-07 DIAGNOSIS — D510 Vitamin B12 deficiency anemia due to intrinsic factor deficiency: Secondary | ICD-10-CM | POA: Diagnosis not present

## 2021-02-19 DIAGNOSIS — H33002 Unspecified retinal detachment with retinal break, left eye: Secondary | ICD-10-CM | POA: Diagnosis not present

## 2021-02-19 DIAGNOSIS — H40053 Ocular hypertension, bilateral: Secondary | ICD-10-CM | POA: Diagnosis not present

## 2021-02-19 DIAGNOSIS — R519 Headache, unspecified: Secondary | ICD-10-CM | POA: Diagnosis not present

## 2021-02-19 DIAGNOSIS — H353131 Nonexudative age-related macular degeneration, bilateral, early dry stage: Secondary | ICD-10-CM | POA: Diagnosis not present

## 2021-02-19 DIAGNOSIS — Z961 Presence of intraocular lens: Secondary | ICD-10-CM | POA: Diagnosis not present

## 2021-02-19 DIAGNOSIS — H43813 Vitreous degeneration, bilateral: Secondary | ICD-10-CM | POA: Diagnosis not present

## 2021-02-25 DIAGNOSIS — I83893 Varicose veins of bilateral lower extremities with other complications: Secondary | ICD-10-CM | POA: Diagnosis not present

## 2021-02-25 DIAGNOSIS — I8311 Varicose veins of right lower extremity with inflammation: Secondary | ICD-10-CM | POA: Diagnosis not present

## 2021-02-25 DIAGNOSIS — I8312 Varicose veins of left lower extremity with inflammation: Secondary | ICD-10-CM | POA: Diagnosis not present

## 2021-03-07 DIAGNOSIS — D510 Vitamin B12 deficiency anemia due to intrinsic factor deficiency: Secondary | ICD-10-CM | POA: Diagnosis not present

## 2021-03-17 ENCOUNTER — Telehealth: Payer: Self-pay | Admitting: Student in an Organized Health Care Education/Training Program

## 2021-03-17 ENCOUNTER — Telehealth: Payer: Self-pay | Admitting: Interventional Cardiology

## 2021-03-17 DIAGNOSIS — L03032 Cellulitis of left toe: Secondary | ICD-10-CM | POA: Diagnosis not present

## 2021-03-17 MED ORDER — VALSARTAN 40 MG PO TABS: ORAL_TABLET | ORAL | 3 refills | Status: DC

## 2021-03-17 NOTE — Telephone Encounter (Signed)
Call placed to Pt.  Per Pt if her systolic BP is less than 123456 she feels weak.  Her current regimen of valsartan is 80 mg PO BID-per Pt over the last few months she has only taken the AM dose because her BP was too low to take it at night (per parameters that she should not take PM dose if systolic 123456).  Discussed with pharmacist.  Will reduce valsartan to 40 mg PO BID with advisement to hold PM dose if systolic 123456.  Pt has been monitoring her BP.  Will have her continue to monitor on this lower dose and call back if she has systolic blood pressures consistently >150.  Pt notified of change.  Pt thanked nurse for call back.  She is hopeful this will make her feel better.

## 2021-03-17 NOTE — Telephone Encounter (Signed)
Pt c/o medication issue:  1. Name of Medication:  valsartan (DIOVAN) 80 MG tablet  2. How are you currently taking this medication (dosage and times per day)? As prescribed  3. Are you having a reaction (difficulty breathing--STAT)?  No   4. What is your medication issue?  Patient states her BP has been low-fluctuating. She assumes the medication is making it too low.  Pt c/o BP issue: STAT if pt c/o blurred vision, one-sided weakness or slurred speech  1. What are your last 5 BP readings?   96/64 (lowest it's gotten) 03/16/21: 141/78 03/17/21: 124/77      117/73  2. Are you having any other symptoms (ex. Dizziness, headache, blurred vision, passed out)?  No   3. What is your BP issue?  Patient states her BP has been low-fluctuating. She assumes the medication is making it too low.

## 2021-03-17 NOTE — Telephone Encounter (Signed)
Patient called to discuss the change in her blood pressure medication.  States that she called this morning and it was determined that she should reduce her valsartan dose to 40 mg twice daily from 80 mg twice daily due to blood pressures being "too low".  She states that she has yet to take the valsartan however because she 1 to see if her blood pressure increased which it ultimately did to the 150s.  She is asymptomatic currently.  She wanted know if she should still dose reduce her valsartan as instructed or take the 80 mg dose.  I instructed her to take the 40 mg of valsartan as instructed (she does not have the new prescription from pharmacy so I instructed her to cut the pill in half).  I further instructed her to check her blood pressures again tomorrow after the dose reduced valsartan and call her primary cardiologist if she has any further questions.  She verbalized understanding.

## 2021-03-25 DIAGNOSIS — E039 Hypothyroidism, unspecified: Secondary | ICD-10-CM | POA: Diagnosis not present

## 2021-03-25 DIAGNOSIS — Z8349 Family history of other endocrine, nutritional and metabolic diseases: Secondary | ICD-10-CM | POA: Diagnosis not present

## 2021-03-31 DIAGNOSIS — M205X2 Other deformities of toe(s) (acquired), left foot: Secondary | ICD-10-CM | POA: Diagnosis not present

## 2021-03-31 DIAGNOSIS — L03032 Cellulitis of left toe: Secondary | ICD-10-CM | POA: Diagnosis not present

## 2021-03-31 DIAGNOSIS — L602 Onychogryphosis: Secondary | ICD-10-CM | POA: Diagnosis not present

## 2021-03-31 DIAGNOSIS — M205X1 Other deformities of toe(s) (acquired), right foot: Secondary | ICD-10-CM | POA: Diagnosis not present

## 2021-04-02 DIAGNOSIS — H43813 Vitreous degeneration, bilateral: Secondary | ICD-10-CM | POA: Diagnosis not present

## 2021-04-02 DIAGNOSIS — Z961 Presence of intraocular lens: Secondary | ICD-10-CM | POA: Diagnosis not present

## 2021-04-02 DIAGNOSIS — R51 Headache with orthostatic component, not elsewhere classified: Secondary | ICD-10-CM | POA: Diagnosis not present

## 2021-04-02 DIAGNOSIS — H40053 Ocular hypertension, bilateral: Secondary | ICD-10-CM | POA: Diagnosis not present

## 2021-04-02 DIAGNOSIS — H353131 Nonexudative age-related macular degeneration, bilateral, early dry stage: Secondary | ICD-10-CM | POA: Diagnosis not present

## 2021-04-02 DIAGNOSIS — H33002 Unspecified retinal detachment with retinal break, left eye: Secondary | ICD-10-CM | POA: Diagnosis not present

## 2021-04-04 DIAGNOSIS — D510 Vitamin B12 deficiency anemia due to intrinsic factor deficiency: Secondary | ICD-10-CM | POA: Diagnosis not present

## 2021-04-16 DIAGNOSIS — N3946 Mixed incontinence: Secondary | ICD-10-CM | POA: Diagnosis not present

## 2021-04-16 DIAGNOSIS — N3020 Other chronic cystitis without hematuria: Secondary | ICD-10-CM | POA: Diagnosis not present

## 2021-04-22 DIAGNOSIS — Z1231 Encounter for screening mammogram for malignant neoplasm of breast: Secondary | ICD-10-CM | POA: Diagnosis not present

## 2021-04-24 DIAGNOSIS — E669 Obesity, unspecified: Secondary | ICD-10-CM | POA: Diagnosis not present

## 2021-04-24 DIAGNOSIS — R5383 Other fatigue: Secondary | ICD-10-CM | POA: Diagnosis not present

## 2021-04-24 DIAGNOSIS — Z6830 Body mass index (BMI) 30.0-30.9, adult: Secondary | ICD-10-CM | POA: Diagnosis not present

## 2021-04-24 DIAGNOSIS — N1831 Chronic kidney disease, stage 3a: Secondary | ICD-10-CM | POA: Diagnosis not present

## 2021-04-24 DIAGNOSIS — M353 Polymyalgia rheumatica: Secondary | ICD-10-CM | POA: Diagnosis not present

## 2021-04-29 DIAGNOSIS — D510 Vitamin B12 deficiency anemia due to intrinsic factor deficiency: Secondary | ICD-10-CM | POA: Diagnosis not present

## 2021-05-12 DIAGNOSIS — E559 Vitamin D deficiency, unspecified: Secondary | ICD-10-CM | POA: Diagnosis not present

## 2021-05-12 DIAGNOSIS — Z23 Encounter for immunization: Secondary | ICD-10-CM | POA: Diagnosis not present

## 2021-05-12 DIAGNOSIS — I701 Atherosclerosis of renal artery: Secondary | ICD-10-CM | POA: Diagnosis not present

## 2021-05-12 DIAGNOSIS — N183 Chronic kidney disease, stage 3 unspecified: Secondary | ICD-10-CM | POA: Diagnosis not present

## 2021-05-12 DIAGNOSIS — D510 Vitamin B12 deficiency anemia due to intrinsic factor deficiency: Secondary | ICD-10-CM | POA: Diagnosis not present

## 2021-05-12 DIAGNOSIS — I10 Essential (primary) hypertension: Secondary | ICD-10-CM | POA: Diagnosis not present

## 2021-05-15 ENCOUNTER — Telehealth: Payer: Self-pay | Admitting: Interventional Cardiology

## 2021-05-15 DIAGNOSIS — D510 Vitamin B12 deficiency anemia due to intrinsic factor deficiency: Secondary | ICD-10-CM | POA: Diagnosis not present

## 2021-05-15 NOTE — Telephone Encounter (Signed)
I spoke with patient.  Valsartan was decreased to 40 BID per phone note from August 1. Patient reports she has been adjusting her Valsartan based on her blood pressure and she will be out of medication tomorrow.  States her blood pressure has been up and down. In early August she took 40 mg twice daily. She then increased to 60 mg twice daily from August 9-August 14.  Starting August 15 she began taking 80 twice daily.  Patient reports in September her BP has been well controlled and she has been taking Valsartan 80 mg once daily.  It did go up earlier today to 132/83 so she took extra 40 mg Valsartan. Patient reports she saw PCP and was told BP issues are related to kidney disease.  She is seeing nephrologist in November.  I asked patient to let us know when she has to make adjustments to her medications so she will not run out of medications. Will forward to Dr Irish Lack to see what dose of Valsartan he recommends.  Patient will need prescription sent to pharmacy tomorrow as she is almost out of medication.

## 2021-05-15 NOTE — Telephone Encounter (Signed)
Pt calling stating that she has increased her medication valsartan 40 mg tablets, back to 80 mg tablets. Pt would like Dr. Hassell Done nurse, Mardene Celeste, RN, to give her a call ASAP, because is out of medication. Please address

## 2021-05-15 NOTE — Telephone Encounter (Signed)
*  STAT* If patient is at the pharmacy, call can be transferred to refill team.   1. Which medications need to be refilled? (please list name of each medication and dose if known)  valsartan (DIOVAN) 40 MG tablet Patient takes one tablet 2x daily  2. Which pharmacy/location (including street and city if local pharmacy) is medication to be sent to? [id number] - [place], [place] - [address]  3. Do they need a 30 day or 90 day supply? 90 with refills   Patient is out of medication

## 2021-05-16 MED ORDER — VALSARTAN 80 MG PO TABS: 80.0000 mg | ORAL_TABLET | Freq: Two times a day (BID) | ORAL | 3 refills | Status: DC

## 2021-05-16 NOTE — Telephone Encounter (Signed)
Can call in valsartan 80 mg BID, use as directed

## 2021-05-16 NOTE — Telephone Encounter (Signed)
Called pt to inform her of MD recommendations.  Per pt she is currently taking valsartan 80 mg PO QD but on occasion if BP is elevated takes BID.

## 2021-06-12 DIAGNOSIS — Z23 Encounter for immunization: Secondary | ICD-10-CM | POA: Diagnosis not present

## 2021-06-16 DIAGNOSIS — M549 Dorsalgia, unspecified: Secondary | ICD-10-CM | POA: Diagnosis not present

## 2021-06-16 DIAGNOSIS — D510 Vitamin B12 deficiency anemia due to intrinsic factor deficiency: Secondary | ICD-10-CM | POA: Diagnosis not present

## 2021-06-17 ENCOUNTER — Other Ambulatory Visit: Payer: Self-pay | Admitting: Family Medicine

## 2021-06-17 ENCOUNTER — Other Ambulatory Visit: Payer: Self-pay

## 2021-06-17 ENCOUNTER — Ambulatory Visit
Admission: RE | Admit: 2021-06-17 | Discharge: 2021-06-17 | Disposition: A | Discharge status: Home / Self Care | Payer: PPO | Source: Ambulatory Visit | Attending: Family Medicine | Admitting: Family Medicine

## 2021-06-17 DIAGNOSIS — M5440 Lumbago with sciatica, unspecified side: Secondary | ICD-10-CM

## 2021-06-17 DIAGNOSIS — M47814 Spondylosis without myelopathy or radiculopathy, thoracic region: Secondary | ICD-10-CM | POA: Diagnosis not present

## 2021-06-17 DIAGNOSIS — M545 Low back pain, unspecified: Secondary | ICD-10-CM | POA: Diagnosis not present

## 2021-06-17 DIAGNOSIS — M419 Scoliosis, unspecified: Secondary | ICD-10-CM | POA: Diagnosis not present

## 2021-06-26 DIAGNOSIS — M353 Polymyalgia rheumatica: Secondary | ICD-10-CM | POA: Diagnosis not present

## 2021-06-26 DIAGNOSIS — N189 Chronic kidney disease, unspecified: Secondary | ICD-10-CM | POA: Diagnosis not present

## 2021-06-26 DIAGNOSIS — N182 Chronic kidney disease, stage 2 (mild): Secondary | ICD-10-CM | POA: Diagnosis not present

## 2021-06-26 DIAGNOSIS — D631 Anemia in chronic kidney disease: Secondary | ICD-10-CM | POA: Diagnosis not present

## 2021-06-26 DIAGNOSIS — E559 Vitamin D deficiency, unspecified: Secondary | ICD-10-CM | POA: Diagnosis not present

## 2021-06-26 DIAGNOSIS — I129 Hypertensive chronic kidney disease with stage 1 through stage 4 chronic kidney disease, or unspecified chronic kidney disease: Secondary | ICD-10-CM | POA: Diagnosis not present

## 2021-07-15 DIAGNOSIS — D510 Vitamin B12 deficiency anemia due to intrinsic factor deficiency: Secondary | ICD-10-CM | POA: Diagnosis not present

## 2021-07-30 DIAGNOSIS — R519 Headache, unspecified: Secondary | ICD-10-CM | POA: Diagnosis not present

## 2021-07-30 DIAGNOSIS — Z961 Presence of intraocular lens: Secondary | ICD-10-CM | POA: Diagnosis not present

## 2021-07-30 DIAGNOSIS — H353131 Nonexudative age-related macular degeneration, bilateral, early dry stage: Secondary | ICD-10-CM | POA: Diagnosis not present

## 2021-07-30 DIAGNOSIS — H43813 Vitreous degeneration, bilateral: Secondary | ICD-10-CM | POA: Diagnosis not present

## 2021-07-30 DIAGNOSIS — H401131 Primary open-angle glaucoma, bilateral, mild stage: Secondary | ICD-10-CM | POA: Diagnosis not present

## 2021-07-30 DIAGNOSIS — H33002 Unspecified retinal detachment with retinal break, left eye: Secondary | ICD-10-CM | POA: Diagnosis not present

## 2021-08-21 DIAGNOSIS — D510 Vitamin B12 deficiency anemia due to intrinsic factor deficiency: Secondary | ICD-10-CM | POA: Diagnosis not present

## 2021-08-25 DIAGNOSIS — R5383 Other fatigue: Secondary | ICD-10-CM | POA: Diagnosis not present

## 2021-08-25 DIAGNOSIS — N1831 Chronic kidney disease, stage 3a: Secondary | ICD-10-CM | POA: Diagnosis not present

## 2021-08-25 DIAGNOSIS — E669 Obesity, unspecified: Secondary | ICD-10-CM | POA: Diagnosis not present

## 2021-08-25 DIAGNOSIS — Z6830 Body mass index (BMI) 30.0-30.9, adult: Secondary | ICD-10-CM | POA: Diagnosis not present

## 2021-08-25 DIAGNOSIS — M353 Polymyalgia rheumatica: Secondary | ICD-10-CM | POA: Diagnosis not present

## 2021-09-08 DIAGNOSIS — J320 Chronic maxillary sinusitis: Secondary | ICD-10-CM | POA: Diagnosis not present

## 2021-09-23 DIAGNOSIS — D510 Vitamin B12 deficiency anemia due to intrinsic factor deficiency: Secondary | ICD-10-CM | POA: Diagnosis not present

## 2021-10-02 DIAGNOSIS — R5381 Other malaise: Secondary | ICD-10-CM | POA: Diagnosis not present

## 2021-10-02 DIAGNOSIS — R0981 Nasal congestion: Secondary | ICD-10-CM | POA: Diagnosis not present

## 2021-10-13 DIAGNOSIS — J320 Chronic maxillary sinusitis: Secondary | ICD-10-CM | POA: Diagnosis not present

## 2021-10-13 DIAGNOSIS — M353 Polymyalgia rheumatica: Secondary | ICD-10-CM | POA: Diagnosis not present

## 2021-10-22 DIAGNOSIS — J320 Chronic maxillary sinusitis: Secondary | ICD-10-CM | POA: Diagnosis not present

## 2021-10-24 DIAGNOSIS — D510 Vitamin B12 deficiency anemia due to intrinsic factor deficiency: Secondary | ICD-10-CM | POA: Diagnosis not present

## 2021-11-24 DIAGNOSIS — D510 Vitamin B12 deficiency anemia due to intrinsic factor deficiency: Secondary | ICD-10-CM | POA: Diagnosis not present

## 2021-12-02 DIAGNOSIS — Z961 Presence of intraocular lens: Secondary | ICD-10-CM | POA: Diagnosis not present

## 2021-12-02 DIAGNOSIS — H33002 Unspecified retinal detachment with retinal break, left eye: Secondary | ICD-10-CM | POA: Diagnosis not present

## 2021-12-02 DIAGNOSIS — H43813 Vitreous degeneration, bilateral: Secondary | ICD-10-CM | POA: Diagnosis not present

## 2021-12-02 DIAGNOSIS — H353131 Nonexudative age-related macular degeneration, bilateral, early dry stage: Secondary | ICD-10-CM | POA: Diagnosis not present

## 2021-12-02 DIAGNOSIS — H401131 Primary open-angle glaucoma, bilateral, mild stage: Secondary | ICD-10-CM | POA: Diagnosis not present

## 2021-12-08 DIAGNOSIS — M79671 Pain in right foot: Secondary | ICD-10-CM | POA: Diagnosis not present

## 2021-12-09 DIAGNOSIS — L723 Sebaceous cyst: Secondary | ICD-10-CM | POA: Diagnosis not present

## 2021-12-09 DIAGNOSIS — L820 Inflamed seborrheic keratosis: Secondary | ICD-10-CM | POA: Diagnosis not present

## 2021-12-09 DIAGNOSIS — L814 Other melanin hyperpigmentation: Secondary | ICD-10-CM | POA: Diagnosis not present

## 2021-12-09 DIAGNOSIS — Z808 Family history of malignant neoplasm of other organs or systems: Secondary | ICD-10-CM | POA: Diagnosis not present

## 2021-12-09 DIAGNOSIS — D225 Melanocytic nevi of trunk: Secondary | ICD-10-CM | POA: Diagnosis not present

## 2021-12-09 DIAGNOSIS — Z85828 Personal history of other malignant neoplasm of skin: Secondary | ICD-10-CM | POA: Diagnosis not present

## 2021-12-09 DIAGNOSIS — L821 Other seborrheic keratosis: Secondary | ICD-10-CM | POA: Diagnosis not present

## 2021-12-09 DIAGNOSIS — L570 Actinic keratosis: Secondary | ICD-10-CM | POA: Diagnosis not present

## 2021-12-16 NOTE — Progress Notes (Signed)
?  ?Cardiology Office Note ? ? ?Date:  12/17/2021  ? ?ID:  Rose Moody, DOB Nov 17, 1940, MRN [id number] ? ?PCP:  Lawerance Cruel, MD  ? ? ?No chief complaint on file. ? ? ? ?Wt Readings from Last 3 Encounters:  ?12/17/21 171 lb (77.6 kg)  ?11/26/20 172 lb 3.2 oz (78.1 kg)  ?03/13/20 159 lb (72.1 kg)  ?  ? ?  ?History of Present Illness: ?Rose Moody is a 81 y.o. female  Was thought to have renal artery stenosis by duplex, but then angiogram was performed in June 2013.  There was no evidence of renal artery stenosis.  She did have dual arterial supply to both kidneys. ?  ?Retired from [employer].  ?  ?BP was well controlled in May 2019.   ?  ?After that, she has had high readings at home.  Valsartan was increased from 80 mg to 160 mg. ?  ?In 2020, it was noted: "SHe has had some fatigue/DOE as well. She reports weakness with minimal exertion.  She can feel weak when she raises her arms up in the shower to wash her hair."   ?  ?Temporal artery biopsy was negative in 2021.  Despite this, rheumatologist felt that she could still have temporal arteritis. ?  ?She had a fall in 2/21 where she broke her foot.  It was after the shower. ?  ?In 2021, it was noted: "She checks her BP at home and will skip her valsartan if it is "low".  I reviewed her home readings.  She has several readings that are in the 13Y systolic.    ?  ?Prednisone has been started at high dose and has been tapered, for PMR. Thsi has likely affected her BP as well." ?  ?She has gained a lot of weight after starting prednisone.  She has lost hair as well. ? ?Bleeding varicose wein in 7/21 prompted visit to ER.  She subsequently had surgery. ? ?Rare lightheadedness in 2023.  Occurred when prednisone was tapered.  No syncope. ? ?Denies : Chest pain. Leg edema. Nitroglycerin use. Orthopnea. Paroxysmal nocturnal dyspnea. Shortness of breath. Syncope.   ? ?Past Medical History:  ?Diagnosis Date  ? Anemia   ? Anxiety   ? has had claustrophobia  ?  Atherosclerosis of renal artery ([place])   ? Chronic kidney disease   ? Stage 3   ? Detached retina   ? Family history of adverse reaction to anesthesia   ? sister has trouble waking up  ? Fibromyalgia   ? GERD (gastroesophageal reflux disease)   ? Headache   ? in her 40's, none now  ? Hypertension   ? Hypothyroidism   ? Incontinence   ? Macular degeneration   ? Osteoarthritis   ? knees and back  ? Pernicious anemia   ? Pneumonia   ? walking pneumonia  ? Stomach ulcer   ? in her 55s  ? Thyroid disease   ? UTI (lower urinary tract infection)   ? Varicose veins   ? Vitamin D deficiency   ? ? ?Past Surgical History:  ?Procedure Laterality Date  ? ABDOMINAL HYSTERECTOMY  age 38  ? APPENDECTOMY  age 10  ? ARTERY BIOPSY Left 10/18/2019  ? Procedure: BIOPSY TEMPORAL ARTERY LEFT;  Surgeon: Rosetta Posner, MD;  Location: [facility];  Service: Vascular;  Laterality: Left;  ? BLADDER SUSPENSION  age 65  ? COLONOSCOPY    ? ENDOVENOUS ABLATION SAPHENOUS VEIN W/ LASER Left 01-16-2016  ?  endovenous laser ablation left small saphenous vein by Curt Jews MD  ? laser surgery for retinal detachment Left 2016  ? Dr. Zigmund Daniel  ? RENAL ANGIOGRAM N/A 01/28/2012  ? Procedure: RENAL ANGIOGRAM;  Surgeon: Jettie Booze, MD;  Location: [facility] CATH LAB;  Service: Cardiovascular;  Laterality: N/A;  ? stab phlebectomy Right 12-08-2012  ? > 20 incisions by Curt Jews MD  ? ? ? ?Current Outpatient Medications  ?Medication Sig Dispense Refill  ? Calcium Carbonate-Vitamin D (CALCIUM + D PO) Take 1 tablet by mouth 2 (two) times daily. Reported on 01/16/2016    ? cephALEXin (KEFLEX) 250 MG capsule Take 250 mg by mouth every evening.    ? cyanocobalamin (,VITAMIN B-12,) 1000 MCG/ML injection Inject 1,000 mcg into the muscle every 30 (thirty) days. Given in doctor's office    ? diphenhydrAMINE (BENADRYL) 25 mg capsule Take 50 mg by mouth at bedtime.    ? dorzolamide-timolol (COSOPT) 22.3-6.8 MG/ML ophthalmic solution 1 drop 2 (two) times daily.    ? estradiol  (ESTRACE) 0.1 MG/GM vaginal cream Place 2-3 g vaginally once a week. Sundays    ? ipratropium (ATROVENT) 0.06 % nasal spray Place 2-3 sprays into both nostrils daily.     ? latanoprost (XALATAN) 0.005 % ophthalmic solution 1 drop at bedtime.    ? Multiple Vitamins-Minerals (CENTRUM SILVER ULTRA WOMENS) TABS Take 1 tablet by mouth every morning.    ? Multiple Vitamins-Minerals (PRESERVISION/LUTEIN PO) Take 1 tablet by mouth 2 (two) times daily.    ? omeprazole (PRILOSEC) 20 MG capsule Take 20 mg by mouth daily.    ? Polyethyl Glycol-Propyl Glycol (SYSTANE ULTRA OP) Apply 1 drop to eye daily as needed.    ? predniSONE (DELTASONE) 10 MG tablet Take 30 mg by mouth daily.    ? sucralfate (CARAFATE) 1 g tablet Take 1 g by mouth 2 (two) times daily.     ? SYNTHROID 75 MCG tablet Take 75 mcg by mouth every morning.    ? valsartan (DIOVAN) 80 MG tablet Take 1 tablet (80 mg total) by mouth 2 (two) times daily. 180 tablet 3  ? fluticasone (FLONASE) 50 MCG/ACT nasal spray Place into both nostrils as needed for allergies or rhinitis. (Patient not taking: Reported on 12/17/2021)    ? NEOMYCIN-POLYMYXIN-HYDROCORTISONE (CORTISPORIN) 1 % SOLN OTIC solution Apply 1-2 drops to toe BID after soaking (Patient not taking: Reported on 12/17/2021) 10 mL 1  ? ?No current facility-administered medications for this visit.  ? ?Facility-Administered Medications Ordered in Other Visits  ?Medication Dose Route Frequency Provider Last Rate Last Admin  ? diphenhydrAMINE (BENADRYL) injection 25 mg  25 mg Intravenous Once Jettie Booze, MD      ? famotidine (PEPCID) 20 mg in sodium chloride 0.9 % 50 mL IVPB  20 mg Intravenous Once Jettie Booze, MD      ? ? ?Allergies:   Trazodone and nefazodone, Minoxidil, Amlodipine besylate, Candesartan cilexetil, Codeine, Contrast media [iodinated contrast media], Cortisone, Dyclonine, Fesoterodine, Irbesartan, Lisinopril, Spironolactone, Tequila sunrise flavor, Toviaz [fesoterodine fumarate], Astelin,  Azelastine, Azithromycin, Cefuroxime axetil, Ciprofloxacin, Metrizamide, Ondansetron, Zithromax [azithromycin dihydrate], and Zofran  ? ? ?Social History:  The patient  reports that she has never smoked. She has never used smokeless tobacco. She reports that she does not drink alcohol and does not use drugs.  ? ?Family History:  The patient's family history includes Bone cancer in her brother; Cancer in her mother; Congestive Heart Failure in her sister; Heart disease in her brother,  brother, father, and sister; Melanoma in her mother; Pneumonia in her sister and sister; Rheum arthritis in her brother; Stroke in her mother.  ? ? ?ROS:  Please see the history of present illness.   Otherwise, review of systems are positive for dizziness; occasional feeling that her HR is low.   All other systems are reviewed and negative.  ? ? ?PHYSICAL EXAM: ?VS:  BP (!) 156/80   Pulse 83   Ht 5' 2.5" (1.588 m)   Wt 171 lb (77.6 kg)   SpO2 100%   BMI 30.78 kg/m?  , BMI Body mass index is 30.78 kg/m?. ?GEN: Well nourished, well developed, in no acute distress ?HEENT: normal ?Neck: no JVD, carotid bruits, or masses ?Cardiac: RRR; no murmurs, rubs, or gallops,no edema  ?Respiratory:  clear to auscultation bilaterally, normal work of breathing ?GI: soft, nontender, nondistended, + BS ?MS: no deformity or atrophy ?Skin: warm and dry, no rash ?Neuro:  Strength and sensation are intact ?Psych: euthymic mood, full affect ? ? ?EKG:   ?The ekg ordered today demonstrates normal ECG ? ? ?Recent Labs: ?No results found for requested labs within last 8760 hours.  ? ?Lipid Panel ?No results found for: CHOL, TRIG, HDL, CHOLHDL, VLDL, LDLCALC, LDLDIRECT ?  ?Other studies Reviewed: ?Additional studies/ records that were reviewed today with results demonstrating: LDL 84. ? ? ?ASSESSMENT AND PLAN: ? ?HTN: Some high readings when she went on prednisone. She feels the valsartan is working well.  ?Venous insufficiency: elevate legs.   ?DOE/lightheadedness/palpitation: 2-week Zio patch.  She counted her pulse and it was slow to her.  Certainly, bradycardia could be a cause of the symptoms.  Her ECG does not show any evidence of conduction disease. ?PMR: On Prednisone

## 2021-12-17 ENCOUNTER — Ambulatory Visit: Payer: PPO | Admitting: Interventional Cardiology

## 2021-12-17 ENCOUNTER — Ambulatory Visit (INDEPENDENT_AMBULATORY_CARE_PROVIDER_SITE_OTHER): Payer: PPO

## 2021-12-17 ENCOUNTER — Encounter: Payer: Self-pay | Admitting: Interventional Cardiology

## 2021-12-17 VITALS — BP 156/80 | HR 83 | Ht 62.5 in | Wt 171.0 lb

## 2021-12-17 DIAGNOSIS — R002 Palpitations: Secondary | ICD-10-CM

## 2021-12-17 DIAGNOSIS — Z8249 Family history of ischemic heart disease and other diseases of the circulatory system: Secondary | ICD-10-CM | POA: Diagnosis not present

## 2021-12-17 DIAGNOSIS — I10 Essential (primary) hypertension: Secondary | ICD-10-CM

## 2021-12-17 DIAGNOSIS — M353 Polymyalgia rheumatica: Secondary | ICD-10-CM

## 2021-12-17 DIAGNOSIS — I83812 Varicose veins of left lower extremities with pain: Secondary | ICD-10-CM

## 2021-12-17 NOTE — Progress Notes (Unsigned)
Enrolled patient for a 14 day Zio XT  monitor to be mailed to patients home  °

## 2021-12-17 NOTE — Patient Instructions (Signed)
Medication Instructions:  ?Your physician recommends that you continue on your current medications as directed. Please refer to the Current Medication list given to you today. ? ?*If you need a refill on your cardiac medications before your next appointment, please call your pharmacy* ? ? ?Lab Work: ?none ?If you have labs (blood work) drawn today and your tests are completely normal, you will receive your results only by: ?MyChart Message (if you have MyChart) OR ?A paper copy in the mail ?If you have any lab test that is abnormal or we need to change your treatment, we will call you to review the results. ? ? ?Testing/Procedures: ?Dr Irish Lack recommends you wear a 2 week zio patch ? ? ?Follow-Up: ?At [facility], you and your health needs are our priority.  As part of our continuing mission to provide you with exceptional heart care, we have created designated Provider Care Teams.  These Care Teams include your primary Cardiologist (physician) and Advanced Practice Providers (APPs -  Physician Assistants and Nurse Practitioners) who all work together to provide you with the care you need, when you need it. ? ?We recommend signing up for the patient portal called "MyChart".  Sign up information is provided on this After Visit Summary.  MyChart is used to connect with patients for Virtual Visits (Telemedicine).  Patients are able to view lab/test results, encounter notes, upcoming appointments, etc.  Non-urgent messages can be sent to your provider as well.   ?To learn more about what you can do with MyChart, go to [email or web address].   ? ?Your next appointment:   ?12 month(s) ? ?The format for your next appointment:   ?In Person ? ?Provider:   ?Larae Grooms, MD   ? ? ?Other Instructions ?ZIO XT- Long Term Monitor Instructions ? ?Your physician has requested you wear a ZIO patch monitor for 14 days.  ?This is a single patch monitor. [employer] supplies one patch monitor per enrollment. Additional ?stickers  are not available. Please do not apply patch if you will be having a Nuclear Stress Test,  ?Echocardiogram, Cardiac CT, MRI, or Chest Xray during the period you would be wearing the  ?monitor. The patch cannot be worn during these tests. You cannot remove and re-apply the  ?ZIO XT patch monitor.  ?Your ZIO patch monitor will be mailed 3 day USPS to your address on file. It may take 3-5 days  ?to receive your monitor after you have been enrolled.  ?Once you have received your monitor, please review the enclosed instructions. Your monitor  ?has already been registered assigning a specific monitor serial # to you. ? ?Billing and Patient Assistance Program Information ? ?We have supplied [employer] with any of your insurance information on file for billing purposes. ?[employer] offers a sliding scale Patient Assistance Program for patients that do not have  ?insurance, or whose insurance does not completely cover the cost of the ZIO monitor.  ?You must apply for the Patient Assistance Program to qualify for this discounted rate.  ?To apply, please call [employer] at [phone], select option 4, select option 2, ask to apply for  ?Patient Assistance Program. Theodore Demark will ask your household income, and how many people  ?are in your household. They will quote your out-of-pocket cost based on that information.  ?[employer] will also be able to set up a 3-month interest-free payment plan if needed. ? ?Applying the monitor ?  ?Shave hair from upper left chest.  ?Hold abrader disc by orange tab. Rub abrader in  40 strokes over the upper left chest as  ?indicated in your monitor instructions.  ?Clean area with 4 enclosed alcohol pads. Let dry.  ?Apply patch as indicated in monitor instructions. Patch will be placed under collarbone on left  ?side of chest with arrow pointing upward.  ?Rub patch adhesive wings for 2 minutes. Remove white label marked "1". Remove the white  ?label marked "2". Rub patch adhesive wings for 2 additional  minutes.  ?While looking in a mirror, press and release button in center of patch. A small green light will  ?flash 3-4 times. This will be your only indicator that the monitor has been turned on.  ?Do not shower for the first 24 hours. You may shower after the first 24 hours.  ?Press the button if you feel a symptom. You will hear a small click. Record Date, Time and  ?Symptom in the Patient Logbook.  ?When you are ready to remove the patch, follow instructions on the last 2 pages of Patient  ?Logbook. Stick patch monitor onto the last page of Patient Logbook.  ?Place Patient Logbook in the blue and white box. Use locking tab on box and tape box closed  ?securely. The blue and white box has prepaid postage on it. Please place it in the mailbox as  ?soon as possible. Your physician should have your test results approximately 7 days after the  ?monitor has been mailed back to [facility].  ?Call [facility] at [phone] if you have questions regarding  ?your ZIO XT patch monitor. Call them immediately if you see an orange light blinking on your  ?monitor.  ?If your monitor falls off in less than 4 days, contact our Monitor department at [phone].  ?If your monitor becomes loose or falls off after 4 days call [employer] at [phone] for  ?suggestions on securing your monitor ? ? ?Important Information About [employer] ? ? ? ? ? ? ?

## 2021-12-22 DIAGNOSIS — R002 Palpitations: Secondary | ICD-10-CM

## 2021-12-25 DIAGNOSIS — D510 Vitamin B12 deficiency anemia due to intrinsic factor deficiency: Secondary | ICD-10-CM | POA: Diagnosis not present

## 2021-12-25 DIAGNOSIS — E669 Obesity, unspecified: Secondary | ICD-10-CM | POA: Diagnosis not present

## 2021-12-25 DIAGNOSIS — R5383 Other fatigue: Secondary | ICD-10-CM | POA: Diagnosis not present

## 2021-12-25 DIAGNOSIS — Z6830 Body mass index (BMI) 30.0-30.9, adult: Secondary | ICD-10-CM | POA: Diagnosis not present

## 2021-12-25 DIAGNOSIS — N1831 Chronic kidney disease, stage 3a: Secondary | ICD-10-CM | POA: Diagnosis not present

## 2021-12-25 DIAGNOSIS — M353 Polymyalgia rheumatica: Secondary | ICD-10-CM | POA: Diagnosis not present

## 2021-12-31 DIAGNOSIS — M353 Polymyalgia rheumatica: Secondary | ICD-10-CM | POA: Diagnosis not present

## 2021-12-31 DIAGNOSIS — D631 Anemia in chronic kidney disease: Secondary | ICD-10-CM | POA: Diagnosis not present

## 2021-12-31 DIAGNOSIS — M79671 Pain in right foot: Secondary | ICD-10-CM | POA: Diagnosis not present

## 2021-12-31 DIAGNOSIS — I129 Hypertensive chronic kidney disease with stage 1 through stage 4 chronic kidney disease, or unspecified chronic kidney disease: Secondary | ICD-10-CM | POA: Diagnosis not present

## 2021-12-31 DIAGNOSIS — S92301A Fracture of unspecified metatarsal bone(s), right foot, initial encounter for closed fracture: Secondary | ICD-10-CM | POA: Diagnosis not present

## 2021-12-31 DIAGNOSIS — N182 Chronic kidney disease, stage 2 (mild): Secondary | ICD-10-CM | POA: Diagnosis not present

## 2022-01-12 DIAGNOSIS — R002 Palpitations: Secondary | ICD-10-CM | POA: Diagnosis not present

## 2022-01-15 DIAGNOSIS — D510 Vitamin B12 deficiency anemia due to intrinsic factor deficiency: Secondary | ICD-10-CM | POA: Diagnosis not present

## 2022-01-20 ENCOUNTER — Telehealth: Payer: Self-pay | Admitting: Interventional Cardiology

## 2022-01-20 NOTE — Telephone Encounter (Signed)
Patient called requesting results from her heart monitor be mailed to her.  Confirmed address on file for mailing.  Patient would like a call back once this has been sent.

## 2022-01-20 NOTE — Telephone Encounter (Signed)
Spoke with patient to discuss monitor results. Patient requests to have monitor results mailed to her. Verified mailing address. Results placed in outgoing mail today. Informed patient to call our office if she has any questions. Patient verbalized understanding and expressed appreciation for call.

## 2022-02-03 DIAGNOSIS — N3020 Other chronic cystitis without hematuria: Secondary | ICD-10-CM | POA: Diagnosis not present

## 2022-02-03 DIAGNOSIS — N3946 Mixed incontinence: Secondary | ICD-10-CM | POA: Diagnosis not present

## 2022-02-05 ENCOUNTER — Encounter (INDEPENDENT_AMBULATORY_CARE_PROVIDER_SITE_OTHER): Payer: PPO | Admitting: Ophthalmology

## 2022-02-05 DIAGNOSIS — H43813 Vitreous degeneration, bilateral: Secondary | ICD-10-CM

## 2022-02-05 DIAGNOSIS — H35033 Hypertensive retinopathy, bilateral: Secondary | ICD-10-CM | POA: Diagnosis not present

## 2022-02-05 DIAGNOSIS — H33302 Unspecified retinal break, left eye: Secondary | ICD-10-CM | POA: Diagnosis not present

## 2022-02-05 DIAGNOSIS — H353132 Nonexudative age-related macular degeneration, bilateral, intermediate dry stage: Secondary | ICD-10-CM | POA: Diagnosis not present

## 2022-02-05 DIAGNOSIS — I10 Essential (primary) hypertension: Secondary | ICD-10-CM | POA: Diagnosis not present

## 2022-02-10 DIAGNOSIS — R350 Frequency of micturition: Secondary | ICD-10-CM | POA: Diagnosis not present

## 2022-02-11 ENCOUNTER — Ambulatory Visit: Payer: PPO | Attending: Interventional Cardiology

## 2022-02-11 DIAGNOSIS — E538 Deficiency of other specified B group vitamins: Secondary | ICD-10-CM | POA: Diagnosis not present

## 2022-02-19 DIAGNOSIS — N3946 Mixed incontinence: Secondary | ICD-10-CM | POA: Diagnosis not present

## 2022-02-24 DIAGNOSIS — R350 Frequency of micturition: Secondary | ICD-10-CM | POA: Diagnosis not present

## 2022-03-03 DIAGNOSIS — R350 Frequency of micturition: Secondary | ICD-10-CM | POA: Diagnosis not present

## 2022-03-10 DIAGNOSIS — R350 Frequency of micturition: Secondary | ICD-10-CM | POA: Diagnosis not present

## 2022-03-10 DIAGNOSIS — D510 Vitamin B12 deficiency anemia due to intrinsic factor deficiency: Secondary | ICD-10-CM | POA: Diagnosis not present

## 2022-03-17 DIAGNOSIS — R350 Frequency of micturition: Secondary | ICD-10-CM | POA: Diagnosis not present

## 2022-03-24 DIAGNOSIS — R350 Frequency of micturition: Secondary | ICD-10-CM | POA: Diagnosis not present

## 2022-03-26 DIAGNOSIS — Z8349 Family history of other endocrine, nutritional and metabolic diseases: Secondary | ICD-10-CM | POA: Diagnosis not present

## 2022-03-26 DIAGNOSIS — E039 Hypothyroidism, unspecified: Secondary | ICD-10-CM | POA: Diagnosis not present

## 2022-03-31 DIAGNOSIS — R350 Frequency of micturition: Secondary | ICD-10-CM | POA: Diagnosis not present

## 2022-04-06 DIAGNOSIS — Z961 Presence of intraocular lens: Secondary | ICD-10-CM | POA: Diagnosis not present

## 2022-04-06 DIAGNOSIS — H353131 Nonexudative age-related macular degeneration, bilateral, early dry stage: Secondary | ICD-10-CM | POA: Diagnosis not present

## 2022-04-06 DIAGNOSIS — H33002 Unspecified retinal detachment with retinal break, left eye: Secondary | ICD-10-CM | POA: Diagnosis not present

## 2022-04-06 DIAGNOSIS — H43813 Vitreous degeneration, bilateral: Secondary | ICD-10-CM | POA: Diagnosis not present

## 2022-04-06 DIAGNOSIS — R519 Headache, unspecified: Secondary | ICD-10-CM | POA: Diagnosis not present

## 2022-04-06 DIAGNOSIS — H401131 Primary open-angle glaucoma, bilateral, mild stage: Secondary | ICD-10-CM | POA: Diagnosis not present

## 2022-04-07 DIAGNOSIS — R350 Frequency of micturition: Secondary | ICD-10-CM | POA: Diagnosis not present

## 2022-04-07 DIAGNOSIS — D510 Vitamin B12 deficiency anemia due to intrinsic factor deficiency: Secondary | ICD-10-CM | POA: Diagnosis not present

## 2022-04-23 DIAGNOSIS — M85852 Other specified disorders of bone density and structure, left thigh: Secondary | ICD-10-CM | POA: Diagnosis not present

## 2022-04-23 DIAGNOSIS — M85851 Other specified disorders of bone density and structure, right thigh: Secondary | ICD-10-CM | POA: Diagnosis not present

## 2022-04-23 DIAGNOSIS — Z1231 Encounter for screening mammogram for malignant neoplasm of breast: Secondary | ICD-10-CM | POA: Diagnosis not present

## 2022-04-24 DIAGNOSIS — N3946 Mixed incontinence: Secondary | ICD-10-CM | POA: Diagnosis not present

## 2022-05-05 DIAGNOSIS — D510 Vitamin B12 deficiency anemia due to intrinsic factor deficiency: Secondary | ICD-10-CM | POA: Diagnosis not present

## 2022-05-13 DIAGNOSIS — N1831 Chronic kidney disease, stage 3a: Secondary | ICD-10-CM | POA: Diagnosis not present

## 2022-05-13 DIAGNOSIS — R5383 Other fatigue: Secondary | ICD-10-CM | POA: Diagnosis not present

## 2022-05-13 DIAGNOSIS — M353 Polymyalgia rheumatica: Secondary | ICD-10-CM | POA: Diagnosis not present

## 2022-05-13 DIAGNOSIS — Z6830 Body mass index (BMI) 30.0-30.9, adult: Secondary | ICD-10-CM | POA: Diagnosis not present

## 2022-05-13 DIAGNOSIS — E669 Obesity, unspecified: Secondary | ICD-10-CM | POA: Diagnosis not present

## 2022-05-14 DIAGNOSIS — E559 Vitamin D deficiency, unspecified: Secondary | ICD-10-CM | POA: Diagnosis not present

## 2022-05-14 DIAGNOSIS — I10 Essential (primary) hypertension: Secondary | ICD-10-CM | POA: Diagnosis not present

## 2022-05-14 DIAGNOSIS — N183 Chronic kidney disease, stage 3 unspecified: Secondary | ICD-10-CM | POA: Diagnosis not present

## 2022-05-14 DIAGNOSIS — R1310 Dysphagia, unspecified: Secondary | ICD-10-CM | POA: Diagnosis not present

## 2022-05-14 DIAGNOSIS — Z6830 Body mass index (BMI) 30.0-30.9, adult: Secondary | ICD-10-CM | POA: Diagnosis not present

## 2022-05-14 DIAGNOSIS — Z23 Encounter for immunization: Secondary | ICD-10-CM | POA: Diagnosis not present

## 2022-05-14 DIAGNOSIS — R0981 Nasal congestion: Secondary | ICD-10-CM | POA: Diagnosis not present

## 2022-05-14 DIAGNOSIS — D510 Vitamin B12 deficiency anemia due to intrinsic factor deficiency: Secondary | ICD-10-CM | POA: Diagnosis not present

## 2022-05-20 ENCOUNTER — Telehealth: Payer: Self-pay | Admitting: Interventional Cardiology

## 2022-05-20 NOTE — Telephone Encounter (Signed)
I spoke with patient.  She reports she has been tapering off prednisone. Since the middle of August she has been having problems with blood pressure. Reports sometimes she does not need to take any Valsartan.  Saw PCP recently and Valsartan was decreased to 40 mg twice daily.  Other than feeling tired today she is feeling OK.  No dizziness.  She will hold Valsartan tonight and let us know if BP runs consistently less than 100.

## 2022-05-20 NOTE — Telephone Encounter (Signed)
Pt c/o BP issue: STAT if pt c/o blurred vision, one-sided weakness or slurred speech  1. What are your last 5 BP readings?  142/91 - 7:30pm  (took bp med at the same time, last night)  122/77 - 8am (did not take bp medication) today  102/79 - 12pm (did not take bp medication) today  105/76 - 3pm ( did not take bp medication) today  99/75  4pm (did not take bp medication) today   2. Are you having any other symptoms (ex. Dizziness, headache, blurred vision, passed out)? No   3. What is your BP issue? Pt states she was told to call back if her bp got in the 90's. Pt recorded her bp today but did not take medication. PT denies any symptoms

## 2022-05-28 DIAGNOSIS — N3946 Mixed incontinence: Secondary | ICD-10-CM | POA: Diagnosis not present

## 2022-06-01 ENCOUNTER — Other Ambulatory Visit: Payer: Self-pay | Admitting: Interventional Cardiology

## 2022-06-02 DIAGNOSIS — R350 Frequency of micturition: Secondary | ICD-10-CM | POA: Diagnosis not present

## 2022-06-03 DIAGNOSIS — D510 Vitamin B12 deficiency anemia due to intrinsic factor deficiency: Secondary | ICD-10-CM | POA: Diagnosis not present

## 2022-06-30 DIAGNOSIS — R350 Frequency of micturition: Secondary | ICD-10-CM | POA: Diagnosis not present

## 2022-06-30 DIAGNOSIS — D510 Vitamin B12 deficiency anemia due to intrinsic factor deficiency: Secondary | ICD-10-CM | POA: Diagnosis not present

## 2022-07-28 DIAGNOSIS — D510 Vitamin B12 deficiency anemia due to intrinsic factor deficiency: Secondary | ICD-10-CM | POA: Diagnosis not present

## 2022-07-28 DIAGNOSIS — R350 Frequency of micturition: Secondary | ICD-10-CM | POA: Diagnosis not present

## 2022-08-24 DIAGNOSIS — H43813 Vitreous degeneration, bilateral: Secondary | ICD-10-CM | POA: Diagnosis not present

## 2022-08-24 DIAGNOSIS — Z961 Presence of intraocular lens: Secondary | ICD-10-CM | POA: Diagnosis not present

## 2022-08-24 DIAGNOSIS — R519 Headache, unspecified: Secondary | ICD-10-CM | POA: Diagnosis not present

## 2022-08-24 DIAGNOSIS — H33002 Unspecified retinal detachment with retinal break, left eye: Secondary | ICD-10-CM | POA: Diagnosis not present

## 2022-08-24 DIAGNOSIS — H401131 Primary open-angle glaucoma, bilateral, mild stage: Secondary | ICD-10-CM | POA: Diagnosis not present

## 2022-08-24 DIAGNOSIS — H353131 Nonexudative age-related macular degeneration, bilateral, early dry stage: Secondary | ICD-10-CM | POA: Diagnosis not present

## 2022-09-03 DIAGNOSIS — E538 Deficiency of other specified B group vitamins: Secondary | ICD-10-CM | POA: Diagnosis not present

## 2022-09-03 DIAGNOSIS — R350 Frequency of micturition: Secondary | ICD-10-CM | POA: Diagnosis not present

## 2022-09-22 DIAGNOSIS — N1831 Chronic kidney disease, stage 3a: Secondary | ICD-10-CM | POA: Diagnosis not present

## 2022-09-22 DIAGNOSIS — E663 Overweight: Secondary | ICD-10-CM | POA: Diagnosis not present

## 2022-09-22 DIAGNOSIS — M8580 Other specified disorders of bone density and structure, unspecified site: Secondary | ICD-10-CM | POA: Diagnosis not present

## 2022-09-22 DIAGNOSIS — R5383 Other fatigue: Secondary | ICD-10-CM | POA: Diagnosis not present

## 2022-09-22 DIAGNOSIS — M353 Polymyalgia rheumatica: Secondary | ICD-10-CM | POA: Diagnosis not present

## 2022-09-22 DIAGNOSIS — Z6829 Body mass index (BMI) 29.0-29.9, adult: Secondary | ICD-10-CM | POA: Diagnosis not present

## 2022-09-25 DIAGNOSIS — R051 Acute cough: Secondary | ICD-10-CM | POA: Diagnosis not present

## 2022-09-25 DIAGNOSIS — U071 COVID-19: Secondary | ICD-10-CM | POA: Diagnosis not present

## 2022-10-06 DIAGNOSIS — D510 Vitamin B12 deficiency anemia due to intrinsic factor deficiency: Secondary | ICD-10-CM | POA: Diagnosis not present

## 2022-10-06 DIAGNOSIS — R350 Frequency of micturition: Secondary | ICD-10-CM | POA: Diagnosis not present

## 2022-11-03 DIAGNOSIS — R350 Frequency of micturition: Secondary | ICD-10-CM | POA: Diagnosis not present

## 2022-11-03 DIAGNOSIS — D510 Vitamin B12 deficiency anemia due to intrinsic factor deficiency: Secondary | ICD-10-CM | POA: Diagnosis not present

## 2022-11-18 DIAGNOSIS — K219 Gastro-esophageal reflux disease without esophagitis: Secondary | ICD-10-CM | POA: Diagnosis not present

## 2022-11-18 DIAGNOSIS — Z6829 Body mass index (BMI) 29.0-29.9, adult: Secondary | ICD-10-CM | POA: Diagnosis not present

## 2022-11-18 DIAGNOSIS — R0981 Nasal congestion: Secondary | ICD-10-CM | POA: Diagnosis not present

## 2022-11-18 DIAGNOSIS — M353 Polymyalgia rheumatica: Secondary | ICD-10-CM | POA: Diagnosis not present

## 2022-11-18 DIAGNOSIS — E559 Vitamin D deficiency, unspecified: Secondary | ICD-10-CM | POA: Diagnosis not present

## 2022-11-18 DIAGNOSIS — M549 Dorsalgia, unspecified: Secondary | ICD-10-CM | POA: Diagnosis not present

## 2022-11-18 DIAGNOSIS — I10 Essential (primary) hypertension: Secondary | ICD-10-CM | POA: Diagnosis not present

## 2022-11-25 DIAGNOSIS — R5381 Other malaise: Secondary | ICD-10-CM | POA: Diagnosis not present

## 2022-11-25 DIAGNOSIS — E039 Hypothyroidism, unspecified: Secondary | ICD-10-CM | POA: Diagnosis not present

## 2022-12-01 DIAGNOSIS — D510 Vitamin B12 deficiency anemia due to intrinsic factor deficiency: Secondary | ICD-10-CM | POA: Diagnosis not present

## 2022-12-01 DIAGNOSIS — R350 Frequency of micturition: Secondary | ICD-10-CM | POA: Diagnosis not present

## 2022-12-09 DIAGNOSIS — Z808 Family history of malignant neoplasm of other organs or systems: Secondary | ICD-10-CM | POA: Diagnosis not present

## 2022-12-09 DIAGNOSIS — L821 Other seborrheic keratosis: Secondary | ICD-10-CM | POA: Diagnosis not present

## 2022-12-09 DIAGNOSIS — Z85828 Personal history of other malignant neoplasm of skin: Secondary | ICD-10-CM | POA: Diagnosis not present

## 2022-12-09 DIAGNOSIS — L820 Inflamed seborrheic keratosis: Secondary | ICD-10-CM | POA: Diagnosis not present

## 2022-12-09 DIAGNOSIS — L814 Other melanin hyperpigmentation: Secondary | ICD-10-CM | POA: Diagnosis not present

## 2022-12-09 DIAGNOSIS — D225 Melanocytic nevi of trunk: Secondary | ICD-10-CM | POA: Diagnosis not present

## 2022-12-22 DIAGNOSIS — D510 Vitamin B12 deficiency anemia due to intrinsic factor deficiency: Secondary | ICD-10-CM | POA: Diagnosis not present

## 2023-01-05 DIAGNOSIS — H33002 Unspecified retinal detachment with retinal break, left eye: Secondary | ICD-10-CM | POA: Diagnosis not present

## 2023-01-05 DIAGNOSIS — Z961 Presence of intraocular lens: Secondary | ICD-10-CM | POA: Diagnosis not present

## 2023-01-05 DIAGNOSIS — H353131 Nonexudative age-related macular degeneration, bilateral, early dry stage: Secondary | ICD-10-CM | POA: Diagnosis not present

## 2023-01-05 DIAGNOSIS — R519 Headache, unspecified: Secondary | ICD-10-CM | POA: Diagnosis not present

## 2023-01-05 DIAGNOSIS — H43813 Vitreous degeneration, bilateral: Secondary | ICD-10-CM | POA: Diagnosis not present

## 2023-01-05 DIAGNOSIS — H401131 Primary open-angle glaucoma, bilateral, mild stage: Secondary | ICD-10-CM | POA: Diagnosis not present

## 2023-01-06 DIAGNOSIS — Z6829 Body mass index (BMI) 29.0-29.9, adult: Secondary | ICD-10-CM | POA: Diagnosis not present

## 2023-01-06 DIAGNOSIS — I701 Atherosclerosis of renal artery: Secondary | ICD-10-CM | POA: Diagnosis not present

## 2023-01-06 DIAGNOSIS — R531 Weakness: Secondary | ICD-10-CM | POA: Diagnosis not present

## 2023-01-06 DIAGNOSIS — R55 Syncope and collapse: Secondary | ICD-10-CM | POA: Diagnosis not present

## 2023-01-06 DIAGNOSIS — N1831 Chronic kidney disease, stage 3a: Secondary | ICD-10-CM | POA: Diagnosis not present

## 2023-01-06 DIAGNOSIS — Z09 Encounter for follow-up examination after completed treatment for conditions other than malignant neoplasm: Secondary | ICD-10-CM | POA: Diagnosis not present

## 2023-01-06 DIAGNOSIS — W19XXXA Unspecified fall, initial encounter: Secondary | ICD-10-CM | POA: Diagnosis not present

## 2023-01-07 DIAGNOSIS — R350 Frequency of micturition: Secondary | ICD-10-CM | POA: Diagnosis not present

## 2023-01-12 DIAGNOSIS — E538 Deficiency of other specified B group vitamins: Secondary | ICD-10-CM | POA: Diagnosis not present

## 2023-01-22 ENCOUNTER — Telehealth: Payer: Self-pay | Admitting: Interventional Cardiology

## 2023-01-22 DIAGNOSIS — M353 Polymyalgia rheumatica: Secondary | ICD-10-CM | POA: Diagnosis not present

## 2023-01-22 DIAGNOSIS — Z6829 Body mass index (BMI) 29.0-29.9, adult: Secondary | ICD-10-CM | POA: Diagnosis not present

## 2023-01-22 DIAGNOSIS — Z9181 History of falling: Secondary | ICD-10-CM | POA: Diagnosis not present

## 2023-01-22 DIAGNOSIS — R002 Palpitations: Secondary | ICD-10-CM | POA: Diagnosis not present

## 2023-01-22 NOTE — Telephone Encounter (Signed)
I spoke with patient.  She reports she fell in her bedroom on 5/14.  States no reason for fall.  Did not feel dizzy. Thinks she hit her head. Has bruise on right leg. Saw PCP on 5/22.  She did not tell  him about feelings she has that occur   on occasion at the top of her chest just under her throat.  She denies pain but states it is a feeling.  Thinks it may have started after fall on 5/14.  Usually happens daily. Lasts a few seconds. Does not feel like flutters she had in the past.  Patient reports she has almost fallen 3 times recently.  She has to catch herself before falling.  No other symptoms when almost falling.  She has appointment with PCP today at 2:30.  I told her to make sure she discussed feeling under throat area and episodes of almost falling with provider at today's visit. ED precautions reviewed with patient.

## 2023-01-22 NOTE — Telephone Encounter (Signed)
Pt c/o of Chest Pain: STAT if CP now or developed within 24 hours  1. Are you having CP right now? No   2. Are you experiencing any other symptoms (ex. SOB, nausea, vomiting, sweating)? No   3. How long have you been experiencing CP? A week now. States its just a little pressure at the top of chest off and on.   4. Is your CP continuous or coming and going? Coming and going   5. Have you taken Nitroglycerin? No  ?

## 2023-02-02 DIAGNOSIS — E538 Deficiency of other specified B group vitamins: Secondary | ICD-10-CM | POA: Diagnosis not present

## 2023-02-04 ENCOUNTER — Encounter (INDEPENDENT_AMBULATORY_CARE_PROVIDER_SITE_OTHER): Payer: PPO | Admitting: Ophthalmology

## 2023-02-04 DIAGNOSIS — R350 Frequency of micturition: Secondary | ICD-10-CM | POA: Diagnosis not present

## 2023-02-08 ENCOUNTER — Encounter (INDEPENDENT_AMBULATORY_CARE_PROVIDER_SITE_OTHER): Payer: PPO | Admitting: Ophthalmology

## 2023-02-08 DIAGNOSIS — H43813 Vitreous degeneration, bilateral: Secondary | ICD-10-CM

## 2023-02-08 DIAGNOSIS — H33302 Unspecified retinal break, left eye: Secondary | ICD-10-CM | POA: Diagnosis not present

## 2023-02-08 DIAGNOSIS — H35033 Hypertensive retinopathy, bilateral: Secondary | ICD-10-CM | POA: Diagnosis not present

## 2023-02-08 DIAGNOSIS — H353132 Nonexudative age-related macular degeneration, bilateral, intermediate dry stage: Secondary | ICD-10-CM

## 2023-02-08 DIAGNOSIS — I10 Essential (primary) hypertension: Secondary | ICD-10-CM | POA: Diagnosis not present

## 2023-02-09 DIAGNOSIS — N182 Chronic kidney disease, stage 2 (mild): Secondary | ICD-10-CM | POA: Diagnosis not present

## 2023-02-09 DIAGNOSIS — I129 Hypertensive chronic kidney disease with stage 1 through stage 4 chronic kidney disease, or unspecified chronic kidney disease: Secondary | ICD-10-CM | POA: Diagnosis not present

## 2023-02-09 DIAGNOSIS — M353 Polymyalgia rheumatica: Secondary | ICD-10-CM | POA: Diagnosis not present

## 2023-02-09 DIAGNOSIS — D631 Anemia in chronic kidney disease: Secondary | ICD-10-CM | POA: Diagnosis not present

## 2023-02-10 ENCOUNTER — Telehealth: Payer: Self-pay | Admitting: Interventional Cardiology

## 2023-02-10 ENCOUNTER — Other Ambulatory Visit: Payer: Self-pay | Admitting: *Deleted

## 2023-02-10 DIAGNOSIS — R002 Palpitations: Secondary | ICD-10-CM

## 2023-02-10 LAB — LAB REPORT - SCANNED
Creatinine, POC: 17.7 mg/dL
EGFR: 62

## 2023-02-10 NOTE — Telephone Encounter (Signed)
Left message to call office

## 2023-02-10 NOTE — Telephone Encounter (Signed)
Calling about getting a heart monitor, patient dr suggest that she needs to wear one. Please advise

## 2023-02-10 NOTE — Telephone Encounter (Signed)
OK to order 14 day Zio patch

## 2023-02-10 NOTE — Telephone Encounter (Signed)
Spoke with the patient who states that she saw her nephrologist yesterday and was telling them about having increased palpitations. They advised her to contact cardiology to inquire about wearing another heart monitor. Patient states that she has noticed more frequent heart palpitations over the last several weeks. She states they used to be more rare but they are occurring daily now. She states that blood pressure and heart rate have been controlled. She has been feeling fine and denies any other symptoms. She is taking her medications as prescribed. She denies any recent changes in diet or increased caffeine intake. Advised that I would send a message to Dr. Eldridge Dace for recommendations

## 2023-02-10 NOTE — Telephone Encounter (Signed)
Patient notified.  She reports she will not be able to put monitor on at home and will need appointment in office to have put on

## 2023-02-11 ENCOUNTER — Telehealth: Payer: Self-pay | Admitting: Interventional Cardiology

## 2023-02-11 ENCOUNTER — Ambulatory Visit: Payer: PPO

## 2023-02-11 DIAGNOSIS — R002 Palpitations: Secondary | ICD-10-CM

## 2023-02-11 NOTE — Telephone Encounter (Signed)
Appointment rescheduled to Friday, 02/12/2023 , 2:30PM.

## 2023-02-11 NOTE — Telephone Encounter (Signed)
Pt called in stating she is unable to make her appt today to have monitor put on. She is not feeling well and needs to r/s, please.

## 2023-02-12 ENCOUNTER — Ambulatory Visit: Payer: PPO | Attending: Cardiovascular Disease

## 2023-02-12 DIAGNOSIS — R002 Palpitations: Secondary | ICD-10-CM | POA: Diagnosis not present

## 2023-02-12 NOTE — Progress Notes (Unsigned)
Enrolled patient for a 14 day Zio XT monitor applied

## 2023-02-19 DIAGNOSIS — I10 Essential (primary) hypertension: Secondary | ICD-10-CM | POA: Diagnosis not present

## 2023-02-19 DIAGNOSIS — I872 Venous insufficiency (chronic) (peripheral): Secondary | ICD-10-CM | POA: Diagnosis not present

## 2023-02-19 DIAGNOSIS — R600 Localized edema: Secondary | ICD-10-CM | POA: Diagnosis not present

## 2023-02-23 DIAGNOSIS — D510 Vitamin B12 deficiency anemia due to intrinsic factor deficiency: Secondary | ICD-10-CM | POA: Diagnosis not present

## 2023-03-03 DIAGNOSIS — I83892 Varicose veins of left lower extremities with other complications: Secondary | ICD-10-CM | POA: Diagnosis not present

## 2023-03-03 DIAGNOSIS — I87392 Chronic venous hypertension (idiopathic) with other complications of left lower extremity: Secondary | ICD-10-CM | POA: Diagnosis not present

## 2023-03-03 DIAGNOSIS — R600 Localized edema: Secondary | ICD-10-CM | POA: Diagnosis not present

## 2023-03-04 DIAGNOSIS — R350 Frequency of micturition: Secondary | ICD-10-CM | POA: Diagnosis not present

## 2023-03-04 DIAGNOSIS — R002 Palpitations: Secondary | ICD-10-CM | POA: Diagnosis not present

## 2023-03-10 ENCOUNTER — Other Ambulatory Visit: Payer: Self-pay | Admitting: Interventional Cardiology

## 2023-03-10 DIAGNOSIS — I83892 Varicose veins of left lower extremities with other complications: Secondary | ICD-10-CM | POA: Diagnosis not present

## 2023-03-16 DIAGNOSIS — E538 Deficiency of other specified B group vitamins: Secondary | ICD-10-CM | POA: Diagnosis not present

## 2023-03-23 DIAGNOSIS — R5383 Other fatigue: Secondary | ICD-10-CM | POA: Diagnosis not present

## 2023-03-23 DIAGNOSIS — M353 Polymyalgia rheumatica: Secondary | ICD-10-CM | POA: Diagnosis not present

## 2023-03-23 DIAGNOSIS — Z6828 Body mass index (BMI) 28.0-28.9, adult: Secondary | ICD-10-CM | POA: Diagnosis not present

## 2023-03-23 DIAGNOSIS — E663 Overweight: Secondary | ICD-10-CM | POA: Diagnosis not present

## 2023-03-23 DIAGNOSIS — N1831 Chronic kidney disease, stage 3a: Secondary | ICD-10-CM | POA: Diagnosis not present

## 2023-03-23 DIAGNOSIS — M8580 Other specified disorders of bone density and structure, unspecified site: Secondary | ICD-10-CM | POA: Diagnosis not present

## 2023-03-24 DIAGNOSIS — I83892 Varicose veins of left lower extremities with other complications: Secondary | ICD-10-CM | POA: Diagnosis not present

## 2023-03-24 DIAGNOSIS — M79662 Pain in left lower leg: Secondary | ICD-10-CM | POA: Diagnosis not present

## 2023-04-01 ENCOUNTER — Other Ambulatory Visit: Payer: Self-pay | Admitting: Interventional Cardiology

## 2023-04-01 DIAGNOSIS — R350 Frequency of micturition: Secondary | ICD-10-CM | POA: Diagnosis not present

## 2023-04-01 NOTE — Progress Notes (Signed)
Cardiology Office Note   Date:  04/02/2023   ID:  Rose Moody, DOB 1941/05/18, MRN [id number]  PCP:  Rose Floro, MD    No chief complaint on file.  HTN  Wt Readings from Last 3 Encounters:  04/02/23 159 lb 3.2 oz (72.2 kg)  12/17/21 171 lb (77.6 kg)  11/26/20 172 lb 3.2 oz (78.1 kg)       History of Present Illness: Rose Moody is a 82 y.o. female   Was thought to have renal artery stenosis by duplex, but then angiogram was performed in June 2013.  There was no evidence of renal artery stenosis.  She did have dual arterial supply to both kidneys.   Retired from [employer].    BP was well controlled in May 2019.     After that, she has had high readings at home.  Valsartan was increased from 80 mg to 160 mg.   In 2020, it was noted: "SHe has had some fatigue/DOE as well. She reports weakness with minimal exertion.  She can feel weak when she raises her arms up in the shower to wash her hair."     Temporal artery biopsy was negative in 2021.  Despite this, rheumatologist felt that she could still have temporal arteritis.   She had a fall in 2/21 where she broke her foot.  It was after the shower.   In 2021, it was noted: "She checks her BP at home and will skip her valsartan if it is "low".  I reviewed her home readings.  She has several readings that are in the 90s systolic.      Prednisone has been started at high dose and has been tapered, for PMR. Thsi has likely affected her BP as well."   She has gained a lot of weight after starting prednisone.  She has lost hair as well.   Bleeding varicose wein in 7/21 prompted visit to ER.  She subsequently had surgery.   Rare lightheadedness in 2023.  Occurred when prednisone was tapered.  No syncope.  Monitor in 2024 showed: "Normal sinus rhythm with rare PACs and rare PVCs.   Rare, brief runs of PACs not associated with symptoms.   No atrial fibrillation.   No sustained arrhythmias.  No symptoms  reported."  In 2024: Has weakness.  Has fallen a few times. Has issues with glaucoma.   Past Medical History:  Diagnosis Date   Anemia    Anxiety    has had claustrophobia   Atherosclerosis of renal artery (HCC)    Chronic kidney disease    Stage 3    Detached retina    Family history of adverse reaction to anesthesia    sister has trouble waking up   Fibromyalgia    GERD (gastroesophageal reflux disease)    Headache    in her 40's, none now   Hypertension    Hypothyroidism    Incontinence    Macular degeneration    Osteoarthritis    knees and back   Pernicious anemia    Pneumonia    walking pneumonia   Stomach ulcer    in her 30s   Thyroid disease    UTI (lower urinary tract infection)    Varicose veins    Vitamin D deficiency     Past Surgical History:  Procedure Laterality Date   ABDOMINAL HYSTERECTOMY  age 12   APPENDECTOMY  age 31   ARTERY BIOPSY Left 10/18/2019   Procedure: BIOPSY  TEMPORAL ARTERY LEFT;  Surgeon: Rose Earthly, MD;  Location: [facility] OR;  Service: Vascular;  Laterality: Left;   BLADDER SUSPENSION  age 89   COLONOSCOPY     ENDOVENOUS ABLATION SAPHENOUS VEIN W/ LASER Left 01-16-2016   endovenous laser ablation left small saphenous vein by Rose Began MD   laser surgery for retinal detachment Left 2016   Dr. Ashley Royalty   RENAL ANGIOGRAM N/A 01/28/2012   Procedure: RENAL ANGIOGRAM;  Surgeon: Rose Crafts, MD;  Location: [facility] CATH LAB;  Service: Cardiovascular;  Laterality: N/A;   stab phlebectomy Right 12-08-2012   > 20 incisions by Rose Began MD     Current Outpatient Medications  Medication Sig Dispense Refill   Calcium Carbonate-Vitamin D (CALCIUM + D PO) Take 1 tablet by mouth 2 (two) times daily. Reported on 01/16/2016     cephALEXin (KEFLEX) 250 MG capsule Take 250 mg by mouth every evening.     cyanocobalamin (,VITAMIN B-12,) 1000 MCG/ML injection Inject 1,000 mcg into the muscle every 30 (thirty) days. Given in doctor's office      diphenhydrAMINE (BENADRYL) 25 mg capsule Take 50 mg by mouth at bedtime.     dorzolamide-timolol (COSOPT) 22.3-6.8 MG/ML ophthalmic solution 1 drop 2 (two) times daily.     estradiol (ESTRACE) 0.1 MG/GM vaginal cream Place 2-3 g vaginally once a week. Sundays     ipratropium (ATROVENT) 0.06 % nasal spray Place 2-3 sprays into both nostrils daily.      latanoprost (XALATAN) 0.005 % ophthalmic solution 1 drop at bedtime.     Multiple Vitamins-Minerals (CENTRUM SILVER ULTRA WOMENS) TABS Take 1 tablet by mouth every morning.     Multiple Vitamins-Minerals (PRESERVISION/LUTEIN PO) Take 1 tablet by mouth 2 (two) times daily.     omeprazole (PRILOSEC) 20 MG capsule Take 20 mg by mouth daily.     Polyethyl Glycol-Propyl Glycol (SYSTANE ULTRA OP) Apply 1 drop to eye daily as needed.     predniSONE (DELTASONE) 1 MG tablet Take 2 mg by mouth daily. Take 1 tablet by mouth and alternate days, the next day take 1.5mg      sucralfate (CARAFATE) 1 g tablet Take 1 g by mouth 2 (two) times daily.      SYNTHROID 75 MCG tablet Take 75 mcg by mouth every morning.     valsartan (DIOVAN) 80 MG tablet Take 1 tablet (80 mg total) by mouth 2 (two) times daily. 180 tablet 0   No current facility-administered medications for this visit.   Facility-Administered Medications Ordered in Other Visits  Medication Dose Route Frequency Provider Last Rate Last Admin   diphenhydrAMINE (BENADRYL) injection 25 mg  25 mg Intravenous Once Rose Crafts, MD       famotidine (PEPCID) 20 mg in sodium chloride 0.9 % 50 mL IVPB  20 mg Intravenous Once Rose Crafts, MD        Allergies:   Trazodone and nefazodone, Minoxidil, Amlodipine besylate, Candesartan cilexetil, Codeine, Contrast media [iodinated contrast media], Cortisone, Dyclonine, Fesoterodine, Irbesartan, Lisinopril, Spironolactone, Tequila sunrise flavor, Toviaz [fesoterodine fumarate], Astelin, Azelastine, Azithromycin, Cefuroxime axetil, Ciprofloxacin,  Metrizamide, Ondansetron, Zithromax [azithromycin dihydrate], and Zofran    Social History:  The patient  reports that she has never smoked. She has never used smokeless tobacco. She reports that she does not drink alcohol and does not use drugs.   Family History:  The patient's family history includes Bone cancer in her brother; Cancer in her mother; Congestive Heart Failure in her sister; Heart  disease in her brother, brother, father, and sister; Melanoma in her mother; Pneumonia in her sister and sister; Rheum arthritis in her brother; Stroke in her mother.    ROS:  Please see the history of present illness.   Otherwise, review of systems are positive for some residual hand pain after a prior fall.   All other systems are reviewed and negative.    PHYSICAL EXAM: VS:  BP 110/66   Pulse 69   Ht 5' 2.5" (1.588 m)   Wt 159 lb 3.2 oz (72.2 kg)   SpO2 97%   BMI 28.65 kg/m  , BMI Body mass index is 28.65 kg/m. GEN: Well nourished, well developed, in no acute distress, frail HEENT: normal Neck: no JVD, carotid bruits, or masses Cardiac: RRR; no murmurs, rubs, or gallops,; chronic left leg edema - wearing compression stocking Respiratory:  clear to auscultation bilaterally, normal work of breathing GI: soft, nontender, nondistended, + BS MS: no deformity or atrophy Skin: warm and dry, no rash Neuro:  Slow gait Psych: euthymic mood, full affect   EKG:   The ekg ordered today demonstrates NSR, no significant ST changes   Recent Labs: No results found for requested labs within last 365 days.   Lipid Panel No results found for: "CHOL", "TRIG", "HDL", "CHOLHDL", "VLDL", "LDLCALC", "LDLDIRECT"   Other studies Reviewed: Additional studies/ records that were reviewed today with results demonstrating: labs reviewed.  Potassium 4.3.   ASSESSMENT AND PLAN:  HTN: The current medical regimen is effective;  continue present plan and medications.  Home readings mostly controlled as well.   120-130s systolic.  Needs fasting lipids with primary care doctor. PMR: Has weakness.  Has fallen a few times. Managed by Rheum.  Coronary artery calcification: 2020 score 9.5.  Low score.  Palpitations: PACs, PVCs noted on monitor.  No sustained sx.    Current medicines are reviewed at length with the patient today.  The patient concerns regarding her medicines were addressed.  The following changes have been made:  No change  Labs/ tests ordered today include:   Orders Placed This Encounter  Procedures   EKG 12-Lead    Recommend 150 minutes/week of aerobic exercise Low fat, low carb, high fiber diet recommended  Disposition:   FU in 1 year   Signed, Lance Muss, MD  04/02/2023 3:08 PM    [facility] [address] [place], [place], [place]  [address] Phone: [phone]; Fax: [phone]

## 2023-04-02 ENCOUNTER — Ambulatory Visit: Payer: PPO | Attending: Interventional Cardiology | Admitting: Interventional Cardiology

## 2023-04-02 ENCOUNTER — Encounter: Payer: Self-pay | Admitting: Interventional Cardiology

## 2023-04-02 VITALS — BP 110/66 | HR 69 | Ht 62.5 in | Wt 159.2 lb

## 2023-04-02 DIAGNOSIS — R002 Palpitations: Secondary | ICD-10-CM

## 2023-04-02 DIAGNOSIS — I10 Essential (primary) hypertension: Secondary | ICD-10-CM | POA: Diagnosis not present

## 2023-04-02 DIAGNOSIS — M353 Polymyalgia rheumatica: Secondary | ICD-10-CM | POA: Diagnosis not present

## 2023-04-02 DIAGNOSIS — Z8249 Family history of ischemic heart disease and other diseases of the circulatory system: Secondary | ICD-10-CM

## 2023-04-02 NOTE — Patient Instructions (Signed)
Medication Instructions:  Your physician recommends that you continue on your current medications as directed. Please refer to the Current Medication list given to you today.  *If you need a refill on your cardiac medications before your next appointment, please call your pharmacy*   Lab Work: none If you have labs (blood work) drawn today and your tests are completely normal, you will receive your results only by: MyChart Message (if you have MyChart) OR A paper copy in the mail If you have any lab test that is abnormal or we need to change your treatment, we will call you to review the results.   Testing/Procedures: none   Follow-Up: At [facility], you and your health needs are our priority.  As part of our continuing mission to provide you with exceptional heart care, we have created designated Provider Care Teams.  These Care Teams include your primary Cardiologist (physician) and Advanced Practice Providers (APPs -  Physician Assistants and Nurse Practitioners) who all work together to provide you with the care you need, when you need it.  We recommend signing up for the patient portal called "MyChart".  Sign up information is provided on this After Visit Summary.  MyChart is used to connect with patients for Virtual Visits (Telemedicine).  Patients are able to view lab/test results, encounter notes, upcoming appointments, etc.  Non-urgent messages can be sent to your provider as well.   To learn more about what you can do with MyChart, go to [email or web address].    Your next appointment:   12 month(s)  Provider:   Jodelle Red, MD    Other Instructions

## 2023-04-06 DIAGNOSIS — D510 Vitamin B12 deficiency anemia due to intrinsic factor deficiency: Secondary | ICD-10-CM | POA: Diagnosis not present

## 2023-04-13 DIAGNOSIS — E039 Hypothyroidism, unspecified: Secondary | ICD-10-CM | POA: Diagnosis not present

## 2023-04-21 DIAGNOSIS — L03032 Cellulitis of left toe: Secondary | ICD-10-CM | POA: Diagnosis not present

## 2023-04-21 DIAGNOSIS — L03031 Cellulitis of right toe: Secondary | ICD-10-CM | POA: Diagnosis not present

## 2023-04-21 DIAGNOSIS — L600 Ingrowing nail: Secondary | ICD-10-CM | POA: Diagnosis not present

## 2023-04-21 DIAGNOSIS — I70203 Unspecified atherosclerosis of native arteries of extremities, bilateral legs: Secondary | ICD-10-CM | POA: Diagnosis not present

## 2023-04-25 DIAGNOSIS — S91109A Unspecified open wound of unspecified toe(s) without damage to nail, initial encounter: Secondary | ICD-10-CM | POA: Diagnosis not present

## 2023-04-27 DIAGNOSIS — Z1231 Encounter for screening mammogram for malignant neoplasm of breast: Secondary | ICD-10-CM | POA: Diagnosis not present

## 2023-04-29 DIAGNOSIS — R350 Frequency of micturition: Secondary | ICD-10-CM | POA: Diagnosis not present

## 2023-05-04 DIAGNOSIS — E538 Deficiency of other specified B group vitamins: Secondary | ICD-10-CM | POA: Diagnosis not present

## 2023-05-05 DIAGNOSIS — L03031 Cellulitis of right toe: Secondary | ICD-10-CM | POA: Diagnosis not present

## 2023-05-05 DIAGNOSIS — L600 Ingrowing nail: Secondary | ICD-10-CM | POA: Diagnosis not present

## 2023-05-05 DIAGNOSIS — I70203 Unspecified atherosclerosis of native arteries of extremities, bilateral legs: Secondary | ICD-10-CM | POA: Diagnosis not present

## 2023-05-13 DIAGNOSIS — I83812 Varicose veins of left lower extremities with pain: Secondary | ICD-10-CM | POA: Diagnosis not present

## 2023-05-13 DIAGNOSIS — I872 Venous insufficiency (chronic) (peripheral): Secondary | ICD-10-CM | POA: Diagnosis not present

## 2023-05-13 DIAGNOSIS — R600 Localized edema: Secondary | ICD-10-CM | POA: Diagnosis not present

## 2023-05-18 DIAGNOSIS — L600 Ingrowing nail: Secondary | ICD-10-CM | POA: Diagnosis not present

## 2023-05-18 DIAGNOSIS — L03031 Cellulitis of right toe: Secondary | ICD-10-CM | POA: Diagnosis not present

## 2023-05-18 DIAGNOSIS — I70203 Unspecified atherosclerosis of native arteries of extremities, bilateral legs: Secondary | ICD-10-CM | POA: Diagnosis not present

## 2023-05-25 DIAGNOSIS — Z23 Encounter for immunization: Secondary | ICD-10-CM | POA: Diagnosis not present

## 2023-05-25 DIAGNOSIS — E538 Deficiency of other specified B group vitamins: Secondary | ICD-10-CM | POA: Diagnosis not present

## 2023-06-01 DIAGNOSIS — N3946 Mixed incontinence: Secondary | ICD-10-CM | POA: Diagnosis not present

## 2023-06-01 DIAGNOSIS — N3020 Other chronic cystitis without hematuria: Secondary | ICD-10-CM | POA: Diagnosis not present

## 2023-06-04 DIAGNOSIS — M25562 Pain in left knee: Secondary | ICD-10-CM | POA: Diagnosis not present

## 2023-06-07 DIAGNOSIS — Z23 Encounter for immunization: Secondary | ICD-10-CM | POA: Diagnosis not present

## 2023-06-08 DIAGNOSIS — S83282A Other tear of lateral meniscus, current injury, left knee, initial encounter: Secondary | ICD-10-CM | POA: Diagnosis not present

## 2023-06-15 DIAGNOSIS — E538 Deficiency of other specified B group vitamins: Secondary | ICD-10-CM | POA: Diagnosis not present

## 2023-06-17 DIAGNOSIS — M25562 Pain in left knee: Secondary | ICD-10-CM | POA: Diagnosis not present

## 2023-06-18 DIAGNOSIS — E559 Vitamin D deficiency, unspecified: Secondary | ICD-10-CM | POA: Diagnosis not present

## 2023-06-18 DIAGNOSIS — E039 Hypothyroidism, unspecified: Secondary | ICD-10-CM | POA: Diagnosis not present

## 2023-06-18 DIAGNOSIS — N1831 Chronic kidney disease, stage 3a: Secondary | ICD-10-CM | POA: Diagnosis not present

## 2023-06-18 DIAGNOSIS — D510 Vitamin B12 deficiency anemia due to intrinsic factor deficiency: Secondary | ICD-10-CM | POA: Diagnosis not present

## 2023-06-29 ENCOUNTER — Other Ambulatory Visit: Payer: Self-pay | Admitting: Interventional Cardiology

## 2023-06-30 DIAGNOSIS — M7122 Synovial cyst of popliteal space [Baker], left knee: Secondary | ICD-10-CM | POA: Diagnosis not present

## 2023-06-30 DIAGNOSIS — S83412A Sprain of medial collateral ligament of left knee, initial encounter: Secondary | ICD-10-CM | POA: Diagnosis not present

## 2023-06-30 DIAGNOSIS — M25462 Effusion, left knee: Secondary | ICD-10-CM | POA: Diagnosis not present

## 2023-06-30 DIAGNOSIS — S82132A Displaced fracture of medial condyle of left tibia, initial encounter for closed fracture: Secondary | ICD-10-CM | POA: Diagnosis not present

## 2023-06-30 DIAGNOSIS — S838X2A Sprain of other specified parts of left knee, initial encounter: Secondary | ICD-10-CM | POA: Diagnosis not present

## 2023-06-30 DIAGNOSIS — S83242A Other tear of medial meniscus, current injury, left knee, initial encounter: Secondary | ICD-10-CM | POA: Diagnosis not present

## 2023-06-30 DIAGNOSIS — S83282A Other tear of lateral meniscus, current injury, left knee, initial encounter: Secondary | ICD-10-CM | POA: Diagnosis not present

## 2023-07-06 DIAGNOSIS — E039 Hypothyroidism, unspecified: Secondary | ICD-10-CM | POA: Diagnosis not present

## 2023-07-06 DIAGNOSIS — D510 Vitamin B12 deficiency anemia due to intrinsic factor deficiency: Secondary | ICD-10-CM | POA: Diagnosis not present

## 2023-07-06 DIAGNOSIS — I10 Essential (primary) hypertension: Secondary | ICD-10-CM | POA: Diagnosis not present

## 2023-07-06 DIAGNOSIS — Z6828 Body mass index (BMI) 28.0-28.9, adult: Secondary | ICD-10-CM | POA: Diagnosis not present

## 2023-07-06 DIAGNOSIS — D72829 Elevated white blood cell count, unspecified: Secondary | ICD-10-CM | POA: Diagnosis not present

## 2023-07-06 DIAGNOSIS — M25562 Pain in left knee: Secondary | ICD-10-CM | POA: Diagnosis not present

## 2023-07-06 DIAGNOSIS — N183 Chronic kidney disease, stage 3 unspecified: Secondary | ICD-10-CM | POA: Diagnosis not present

## 2023-07-06 DIAGNOSIS — S83242A Other tear of medial meniscus, current injury, left knee, initial encounter: Secondary | ICD-10-CM | POA: Diagnosis not present

## 2023-07-21 DIAGNOSIS — M353 Polymyalgia rheumatica: Secondary | ICD-10-CM | POA: Diagnosis not present

## 2023-07-21 DIAGNOSIS — Z6828 Body mass index (BMI) 28.0-28.9, adult: Secondary | ICD-10-CM | POA: Diagnosis not present

## 2023-07-21 DIAGNOSIS — M170 Bilateral primary osteoarthritis of knee: Secondary | ICD-10-CM | POA: Diagnosis not present

## 2023-07-21 DIAGNOSIS — N1831 Chronic kidney disease, stage 3a: Secondary | ICD-10-CM | POA: Diagnosis not present

## 2023-07-21 DIAGNOSIS — E663 Overweight: Secondary | ICD-10-CM | POA: Diagnosis not present

## 2023-07-21 DIAGNOSIS — R5383 Other fatigue: Secondary | ICD-10-CM | POA: Diagnosis not present

## 2023-07-21 DIAGNOSIS — M8580 Other specified disorders of bone density and structure, unspecified site: Secondary | ICD-10-CM | POA: Diagnosis not present

## 2023-07-26 DIAGNOSIS — H353131 Nonexudative age-related macular degeneration, bilateral, early dry stage: Secondary | ICD-10-CM | POA: Diagnosis not present

## 2023-07-26 DIAGNOSIS — Z961 Presence of intraocular lens: Secondary | ICD-10-CM | POA: Diagnosis not present

## 2023-07-26 DIAGNOSIS — H33002 Unspecified retinal detachment with retinal break, left eye: Secondary | ICD-10-CM | POA: Diagnosis not present

## 2023-07-26 DIAGNOSIS — H43813 Vitreous degeneration, bilateral: Secondary | ICD-10-CM | POA: Diagnosis not present

## 2023-07-26 DIAGNOSIS — R519 Headache, unspecified: Secondary | ICD-10-CM | POA: Diagnosis not present

## 2023-07-26 DIAGNOSIS — H401131 Primary open-angle glaucoma, bilateral, mild stage: Secondary | ICD-10-CM | POA: Diagnosis not present

## 2023-07-27 DIAGNOSIS — D729 Disorder of white blood cells, unspecified: Secondary | ICD-10-CM | POA: Diagnosis not present

## 2023-07-27 DIAGNOSIS — M1712 Unilateral primary osteoarthritis, left knee: Secondary | ICD-10-CM | POA: Diagnosis not present

## 2023-07-27 DIAGNOSIS — R799 Abnormal finding of blood chemistry, unspecified: Secondary | ICD-10-CM | POA: Diagnosis not present

## 2023-07-27 DIAGNOSIS — I10 Essential (primary) hypertension: Secondary | ICD-10-CM | POA: Diagnosis not present

## 2023-07-27 DIAGNOSIS — E7800 Pure hypercholesterolemia, unspecified: Secondary | ICD-10-CM | POA: Diagnosis not present

## 2023-07-27 DIAGNOSIS — D510 Vitamin B12 deficiency anemia due to intrinsic factor deficiency: Secondary | ICD-10-CM | POA: Diagnosis not present

## 2023-11-11 DIAGNOSIS — E538 Deficiency of other specified B group vitamins: Secondary | ICD-10-CM | POA: Diagnosis not present

## 2023-11-23 DIAGNOSIS — H33002 Unspecified retinal detachment with retinal break, left eye: Secondary | ICD-10-CM | POA: Diagnosis not present

## 2023-11-23 DIAGNOSIS — H401131 Primary open-angle glaucoma, bilateral, mild stage: Secondary | ICD-10-CM | POA: Diagnosis not present

## 2023-11-23 DIAGNOSIS — H43813 Vitreous degeneration, bilateral: Secondary | ICD-10-CM | POA: Diagnosis not present

## 2023-11-23 DIAGNOSIS — H353131 Nonexudative age-related macular degeneration, bilateral, early dry stage: Secondary | ICD-10-CM | POA: Diagnosis not present

## 2023-11-23 DIAGNOSIS — R519 Headache, unspecified: Secondary | ICD-10-CM | POA: Diagnosis not present

## 2023-11-25 DIAGNOSIS — M353 Polymyalgia rheumatica: Secondary | ICD-10-CM | POA: Diagnosis not present

## 2023-11-25 DIAGNOSIS — M8580 Other specified disorders of bone density and structure, unspecified site: Secondary | ICD-10-CM | POA: Diagnosis not present

## 2023-11-25 DIAGNOSIS — M170 Bilateral primary osteoarthritis of knee: Secondary | ICD-10-CM | POA: Diagnosis not present

## 2023-11-25 DIAGNOSIS — R5383 Other fatigue: Secondary | ICD-10-CM | POA: Diagnosis not present

## 2023-11-25 DIAGNOSIS — N1831 Chronic kidney disease, stage 3a: Secondary | ICD-10-CM | POA: Diagnosis not present

## 2023-12-02 DIAGNOSIS — D510 Vitamin B12 deficiency anemia due to intrinsic factor deficiency: Secondary | ICD-10-CM | POA: Diagnosis not present

## 2023-12-07 DIAGNOSIS — M353 Polymyalgia rheumatica: Secondary | ICD-10-CM | POA: Diagnosis not present

## 2023-12-07 DIAGNOSIS — R5383 Other fatigue: Secondary | ICD-10-CM | POA: Diagnosis not present

## 2023-12-13 DIAGNOSIS — Z6828 Body mass index (BMI) 28.0-28.9, adult: Secondary | ICD-10-CM | POA: Diagnosis not present

## 2023-12-13 DIAGNOSIS — R5383 Other fatigue: Secondary | ICD-10-CM | POA: Diagnosis not present

## 2023-12-13 DIAGNOSIS — M8580 Other specified disorders of bone density and structure, unspecified site: Secondary | ICD-10-CM | POA: Diagnosis not present

## 2023-12-13 DIAGNOSIS — M79673 Pain in unspecified foot: Secondary | ICD-10-CM | POA: Diagnosis not present

## 2023-12-13 DIAGNOSIS — R0609 Other forms of dyspnea: Secondary | ICD-10-CM | POA: Diagnosis not present

## 2023-12-22 DIAGNOSIS — Z808 Family history of malignant neoplasm of other organs or systems: Secondary | ICD-10-CM | POA: Diagnosis not present

## 2023-12-22 DIAGNOSIS — D225 Melanocytic nevi of trunk: Secondary | ICD-10-CM | POA: Diagnosis not present

## 2023-12-22 DIAGNOSIS — Z85828 Personal history of other malignant neoplasm of skin: Secondary | ICD-10-CM | POA: Diagnosis not present

## 2023-12-22 DIAGNOSIS — L821 Other seborrheic keratosis: Secondary | ICD-10-CM | POA: Diagnosis not present

## 2023-12-22 DIAGNOSIS — L814 Other melanin hyperpigmentation: Secondary | ICD-10-CM | POA: Diagnosis not present

## 2023-12-23 DIAGNOSIS — E538 Deficiency of other specified B group vitamins: Secondary | ICD-10-CM | POA: Diagnosis not present

## 2024-01-24 ENCOUNTER — Ambulatory Visit (HOSPITAL_BASED_OUTPATIENT_CLINIC_OR_DEPARTMENT_OTHER): Admitting: Cardiology

## 2024-02-07 ENCOUNTER — Encounter (INDEPENDENT_AMBULATORY_CARE_PROVIDER_SITE_OTHER): Payer: PPO | Admitting: Ophthalmology

## 2024-02-07 DIAGNOSIS — H33302 Unspecified retinal break, left eye: Secondary | ICD-10-CM | POA: Diagnosis not present

## 2024-02-07 DIAGNOSIS — H43813 Vitreous degeneration, bilateral: Secondary | ICD-10-CM

## 2024-02-07 DIAGNOSIS — I10 Essential (primary) hypertension: Secondary | ICD-10-CM | POA: Diagnosis not present

## 2024-02-07 DIAGNOSIS — H353132 Nonexudative age-related macular degeneration, bilateral, intermediate dry stage: Secondary | ICD-10-CM

## 2024-02-07 DIAGNOSIS — H35033 Hypertensive retinopathy, bilateral: Secondary | ICD-10-CM

## 2024-02-15 DIAGNOSIS — N182 Chronic kidney disease, stage 2 (mild): Secondary | ICD-10-CM | POA: Diagnosis not present

## 2024-02-15 DIAGNOSIS — N189 Chronic kidney disease, unspecified: Secondary | ICD-10-CM | POA: Diagnosis not present

## 2024-02-15 DIAGNOSIS — M353 Polymyalgia rheumatica: Secondary | ICD-10-CM | POA: Diagnosis not present

## 2024-02-15 DIAGNOSIS — I129 Hypertensive chronic kidney disease with stage 1 through stage 4 chronic kidney disease, or unspecified chronic kidney disease: Secondary | ICD-10-CM | POA: Diagnosis not present

## 2024-02-15 DIAGNOSIS — D631 Anemia in chronic kidney disease: Secondary | ICD-10-CM | POA: Diagnosis not present

## 2024-03-18 DIAGNOSIS — R0689 Other abnormalities of breathing: Secondary | ICD-10-CM | POA: Diagnosis not present

## 2024-03-29 DIAGNOSIS — H401131 Primary open-angle glaucoma, bilateral, mild stage: Secondary | ICD-10-CM | POA: Diagnosis not present

## 2024-03-29 DIAGNOSIS — Z961 Presence of intraocular lens: Secondary | ICD-10-CM | POA: Diagnosis not present

## 2024-04-11 ENCOUNTER — Ambulatory Visit (HOSPITAL_BASED_OUTPATIENT_CLINIC_OR_DEPARTMENT_OTHER): Admitting: Cardiology

## 2024-04-11 ENCOUNTER — Encounter (HOSPITAL_BASED_OUTPATIENT_CLINIC_OR_DEPARTMENT_OTHER): Payer: Self-pay | Admitting: Cardiology

## 2024-04-11 VITALS — BP 130/80 | HR 66 | Ht 62.0 in | Wt 169.0 lb

## 2024-04-11 DIAGNOSIS — R002 Palpitations: Secondary | ICD-10-CM

## 2024-04-11 DIAGNOSIS — R600 Localized edema: Secondary | ICD-10-CM

## 2024-04-11 DIAGNOSIS — I2510 Atherosclerotic heart disease of native coronary artery without angina pectoris: Secondary | ICD-10-CM | POA: Diagnosis not present

## 2024-04-11 DIAGNOSIS — I10 Essential (primary) hypertension: Secondary | ICD-10-CM

## 2024-04-11 DIAGNOSIS — Z7189 Other specified counseling: Secondary | ICD-10-CM

## 2024-04-11 NOTE — Progress Notes (Signed)
 Cardiology Office Note:  .   Date:  04/11/2024  ID:  Rose Moody, DOB 1940-10-20, MRN [id number] PCP: Okey Carlin Redbird, MD  [facility] Providers Cardiologist:  Shelda Bruckner, MD {  History of Present Illness: .   Rose Moody is a 83 y.o. female with PMH hypertension, coronary artery calcification, palpitations. She was previously followed by Dr. Dann and established care with me on 04/11/24.  Pertinent CV history: Renal dopplers concerning for renal artery stenosis, but angiogram was normal, did show dual arterial supply to both kidneys. Temporal artery biopsy negative in 2021. Treated for PMR. Monitor 2024 with rare, brief runs of PACs, otherwise unremarkable. Ca score 202 was 9.5  Today: Overall doing well from heart perspective. Was gradually able to wean off of prednisone  in March of this year. Was having palpitations while on prednisone , had two monitors in the last few years which were unremarkable. Has not had palpitations since stopping the prednisone .  Remotely had one episode of chest pain at night while on prednisone , has not had any symptoms since. That episode was found to be a reaction to trazodone.  Had mild of shortness of breath and dizziness last week which she attributes to going out in the heat, now resolved.  ROS: Denies recent chest pain. No PND, orthopnea, change in LE edema or unexpected weight gain. No syncope or recent palpitations. ROS otherwise negative except as noted.   Studies Reviewed: SABRA    EKG:  EKG Interpretation Date/Time:  Tuesday April 11 2024 16:06:13 EDT Ventricular Rate:  66 PR Interval:  164 QRS Duration:  76 QT Interval:  392 QTC Calculation: 410 R Axis:   78  Text Interpretation: Normal sinus rhythm Normal ECG When compared with ECG of 02-Apr-2023 15:00, No significant change was found Confirmed by Bruckner Shelda [phone]) on 04/11/2024 6:02:18 PM    Physical Exam:   VS:  BP 130/80   Pulse 66   Ht 5' 2  (1.575 m)   Wt 169 lb (76.7 kg) Comment: Patient refused to weigh  SpO2 99%   BMI 30.91 kg/m    Wt Readings from Last 3 Encounters:  04/11/24 169 lb (76.7 kg)  04/02/23 159 lb 3.2 oz (72.2 kg)  12/17/21 171 lb (77.6 kg)    GEN: Well nourished, well developed in no acute distress HEENT: Normal, moist mucous membranes NECK: No JVD CARDIAC: regular rhythm, normal S1 and S2, no rubs or gallops. No murmur. VASCULAR: Radial and DP pulses 2+ bilaterally. No carotid bruits RESPIRATORY:  Clear to auscultation without rales, wheezing or rhonchi  ABDOMEN: Soft, non-tender, non-distended MUSCULOSKELETAL:  Ambulates independently SKIN: Warm and dry, 1+ pitting LE edema L, none on R NEUROLOGIC:  Alert and oriented x 3. No focal neuro deficits noted. PSYCHIATRIC:  Normal affect    ASSESSMENT AND PLAN: .    Hypertension: well controlled, continue valsartan  80 mg daily  Coronary artery calcification: low score, given age, not on statin. Reviewed lipids from 07/2023  Palpitations: now resolved off prednisone . Reviewed prior monitors.  Chronic LE edema: likely 2/2 long term inflammation and prior fractures. Prior echo normal.  CV risk counseling and prevention -recommend heart healthy/Mediterranean diet, with whole grains, fruits, vegetable, fish, lean meats, nuts, and olive oil. Limit salt. -recommend moderate walking, 3-5 times/week for 30-50 minutes each session. Aim for at least 150 minutes/week. Goal should be pace of 3 miles/hours, or walking 1.5 miles in 30 minutes -recommend avoidance of tobacco products. Avoid excess alcohol.  Dispo: 1  year or sooner as needed  Signed, Shelda Bruckner, MD   Shelda Bruckner, MD, PhD, [facility]  [facility]  [place]  [facility] at [facility] [address], [address] [place], [place] [address] [phone]

## 2024-04-11 NOTE — Patient Instructions (Signed)

## 2024-05-02 DIAGNOSIS — Z1231 Encounter for screening mammogram for malignant neoplasm of breast: Secondary | ICD-10-CM | POA: Diagnosis not present

## 2024-05-10 DIAGNOSIS — Z23 Encounter for immunization: Secondary | ICD-10-CM | POA: Diagnosis not present

## 2024-05-10 DIAGNOSIS — E538 Deficiency of other specified B group vitamins: Secondary | ICD-10-CM | POA: Diagnosis not present

## 2024-05-25 DIAGNOSIS — M8580 Other specified disorders of bone density and structure, unspecified site: Secondary | ICD-10-CM | POA: Diagnosis not present

## 2024-05-25 DIAGNOSIS — N1831 Chronic kidney disease, stage 3a: Secondary | ICD-10-CM | POA: Diagnosis not present

## 2024-05-25 DIAGNOSIS — E663 Overweight: Secondary | ICD-10-CM | POA: Diagnosis not present

## 2024-05-25 DIAGNOSIS — Z6829 Body mass index (BMI) 29.0-29.9, adult: Secondary | ICD-10-CM | POA: Diagnosis not present

## 2024-05-25 DIAGNOSIS — R5383 Other fatigue: Secondary | ICD-10-CM | POA: Diagnosis not present

## 2024-05-25 DIAGNOSIS — M353 Polymyalgia rheumatica: Secondary | ICD-10-CM | POA: Diagnosis not present

## 2024-05-25 DIAGNOSIS — M170 Bilateral primary osteoarthritis of knee: Secondary | ICD-10-CM | POA: Diagnosis not present

## 2024-05-30 DIAGNOSIS — R399 Unspecified symptoms and signs involving the genitourinary system: Secondary | ICD-10-CM | POA: Diagnosis not present

## 2024-05-31 DIAGNOSIS — N390 Urinary tract infection, site not specified: Secondary | ICD-10-CM | POA: Diagnosis not present

## 2024-05-31 DIAGNOSIS — R351 Nocturia: Secondary | ICD-10-CM | POA: Diagnosis not present

## 2024-05-31 DIAGNOSIS — R350 Frequency of micturition: Secondary | ICD-10-CM | POA: Diagnosis not present

## 2024-06-22 DIAGNOSIS — L84 Corns and callosities: Secondary | ICD-10-CM | POA: Diagnosis not present

## 2024-06-22 DIAGNOSIS — B351 Tinea unguium: Secondary | ICD-10-CM | POA: Diagnosis not present

## 2024-06-22 DIAGNOSIS — L03032 Cellulitis of left toe: Secondary | ICD-10-CM | POA: Diagnosis not present

## 2024-06-22 DIAGNOSIS — L600 Ingrowing nail: Secondary | ICD-10-CM | POA: Diagnosis not present

## 2024-06-22 DIAGNOSIS — I70203 Unspecified atherosclerosis of native arteries of extremities, bilateral legs: Secondary | ICD-10-CM | POA: Diagnosis not present

## 2024-06-22 DIAGNOSIS — L03031 Cellulitis of right toe: Secondary | ICD-10-CM | POA: Diagnosis not present

## 2025-02-07 ENCOUNTER — Encounter (INDEPENDENT_AMBULATORY_CARE_PROVIDER_SITE_OTHER): Admitting: Ophthalmology
